# Patient Record
Sex: Male | Born: 1965 | Race: White | Hispanic: No | Marital: Married | Smoking: Former smoker
Health system: Southern US, Community
[De-identification: ages and names within clinical notes are randomized; demographics above are authoritative.]

## PROBLEM LIST (undated history)

## (undated) DIAGNOSIS — N200 Calculus of kidney: Secondary | ICD-10-CM

## (undated) DIAGNOSIS — E079 Disorder of thyroid, unspecified: Secondary | ICD-10-CM

## (undated) HISTORY — PX: FINGER AMPUTATION: SHX636

## (undated) HISTORY — DX: Calculus of kidney: N20.0

## (undated) HISTORY — DX: Disorder of thyroid, unspecified: E07.9

---

## 2005-09-02 ENCOUNTER — Emergency Department: Payer: Self-pay | Admitting: Emergency Medicine

## 2007-07-07 DIAGNOSIS — N200 Calculus of kidney: Secondary | ICD-10-CM

## 2007-07-07 HISTORY — DX: Calculus of kidney: N20.0

## 2009-06-27 ENCOUNTER — Emergency Department: Payer: Self-pay | Admitting: Emergency Medicine

## 2009-11-07 ENCOUNTER — Ambulatory Visit: Payer: Self-pay | Admitting: Vascular Surgery

## 2010-10-23 ENCOUNTER — Ambulatory Visit: Payer: Self-pay | Admitting: Family Medicine

## 2011-01-08 ENCOUNTER — Ambulatory Visit: Payer: Self-pay | Admitting: Internal Medicine

## 2011-03-23 ENCOUNTER — Encounter: Payer: Self-pay | Admitting: Internal Medicine

## 2011-03-23 ENCOUNTER — Ambulatory Visit (INDEPENDENT_AMBULATORY_CARE_PROVIDER_SITE_OTHER): Payer: PRIVATE HEALTH INSURANCE | Admitting: Internal Medicine

## 2011-03-23 VITALS — BP 132/78 | HR 66 | Temp 97.6°F | Resp 16 | Ht 69.0 in | Wt 161.5 lb

## 2011-03-23 DIAGNOSIS — N201 Calculus of ureter: Secondary | ICD-10-CM | POA: Insufficient documentation

## 2011-03-23 DIAGNOSIS — F17200 Nicotine dependence, unspecified, uncomplicated: Secondary | ICD-10-CM

## 2011-03-23 DIAGNOSIS — E079 Disorder of thyroid, unspecified: Secondary | ICD-10-CM | POA: Insufficient documentation

## 2011-03-23 MED ORDER — HYDROCODONE-ACETAMINOPHEN 7.5-750 MG PO TABS: 1.0000 | ORAL_TABLET | Freq: Four times a day (QID) | ORAL | Status: DC | PRN

## 2011-03-23 MED ORDER — BUPROPION HCL ER (SR) 100 MG PO TB12: 100.0000 mg | ORAL_TABLET | Freq: Two times a day (BID) | ORAL | Status: DC

## 2011-03-23 NOTE — Patient Instructions (Signed)
You may use the hydrocodone for thumb pain or severe knee pain  But you should not exceed 2 grams (2000 mg ) of tylenol (acetinominophen) on a daily basis.   We will refer you to Dr. Amanda Pea in [facility]  The hand surgeon  And   Dr. Ernest Pine at [place] for knee problems.

## 2011-03-23 NOTE — Progress Notes (Signed)
  Subjective:    Patient ID: Jeffery Horton, male    DOB: 12-Apr-1966, 45 y.o.   MRN: [id number]  HPI 45 yo white male recently referred to Endocrinology for weight loss and suppressed TSH , workup and followup pending,   here for evaluation of left thumb pain which is episodic and severe.  Preciptants unclear. Has history of remote thumb injury with prior vascular evaluation for ischemic symptoms, with no intervention possible.  He has episodes of pain radiating up to elbow which occure periodically and last for several days.  Also cotinuing to have bilateral knee pain aggravated by prlonged standing on concrete pavements.    Review of Systems  Constitutional: Negative for fever, chills, diaphoresis, activity change, appetite change, fatigue and unexpected weight change.  HENT: Negative for hearing loss, ear pain, nosebleeds, congestion, sore throat, facial swelling, rhinorrhea, sneezing, drooling, mouth sores, trouble swallowing, neck pain, neck stiffness, dental problem, voice change, postnasal drip, sinus pressure, tinnitus and ear discharge.   Eyes: Negative for photophobia, pain, discharge, redness, itching and visual disturbance.  Respiratory: Negative for apnea, cough, choking, chest tightness, shortness of breath, wheezing and stridor.   Cardiovascular: Negative for chest pain, palpitations and leg swelling.  Gastrointestinal: Negative for nausea, vomiting, abdominal pain, diarrhea, constipation, blood in stool, abdominal distention, anal bleeding and rectal pain.  Genitourinary: Negative for dysuria, urgency, frequency, hematuria, flank pain, decreased urine volume, scrotal swelling, difficulty urinating and testicular pain.  Musculoskeletal: Negative for myalgias, back pain, joint swelling, arthralgias and gait problem.  Skin: Negative for color change, rash and wound.  Neurological: Negative for dizziness, tremors, seizures, syncope, speech difficulty, weakness, light-headedness,  numbness and headaches.  Psychiatric/Behavioral: Negative for suicidal ideas, hallucinations, behavioral problems, confusion, sleep disturbance, dysphoric mood, decreased concentration and agitation. The patient is not nervous/anxious.        Objective:   Physical Exam  Constitutional: He is oriented to person, place, and time.  HENT:  Head: Normocephalic and atraumatic.  Mouth/Throat: Oropharynx is clear and moist.  Eyes: Conjunctivae and EOM are normal.  Neck: Normal range of motion. Neck supple. No JVD present. No thyromegaly present.  Cardiovascular: Normal rate, regular rhythm and normal heart sounds.   Pulmonary/Chest: Effort normal and breath sounds normal. He has no wheezes. He has no rales.  Abdominal: Soft. Bowel sounds are normal. He exhibits no mass. There is no tenderness. There is no rebound.  Musculoskeletal: Normal range of motion. He exhibits no edema.  Neurological: He is alert and oriented to person, place, and time.  Skin: Skin is warm and dry.  Psychiatric: He has a normal mood and affect.          Assessment & Plan:  Right thumb pain:  Origin of pain is part ischemic part arthritic from prior trauma.  Vascular evaluation last week was without intervention,  He will need referral to Dr. 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  He is taking tramadol and ibuprofen with incomplete relief.  Refer to Dr. Ernest Pine.  Hyperthyroidism:  Patient was referred to Dr. Tedd Sias but did not call for results of serologies which were drawn an initial visit. Encouraged him to followup with her.

## 2011-03-30 ENCOUNTER — Ambulatory Visit: Payer: Self-pay

## 2011-04-17 ENCOUNTER — Encounter: Payer: Self-pay | Admitting: Internal Medicine

## 2011-04-24 ENCOUNTER — Telehealth: Payer: Self-pay | Admitting: Internal Medicine

## 2011-04-24 DIAGNOSIS — Z720 Tobacco use: Secondary | ICD-10-CM

## 2011-04-24 NOTE — Telephone Encounter (Signed)
Pt came in you gave him bupropion hcl to help him quit smoking.  This is giving headache during day.  Can you prescribe something else  You call xantax [facility]

## 2011-04-24 NOTE — Telephone Encounter (Signed)
Please advise 

## 2011-04-25 MED ORDER — VARENICLINE TARTRATE 0.5 MG X 11 & 1 MG X 42 PO MISC: ORAL | Status: DC

## 2011-04-25 NOTE — Telephone Encounter (Signed)
You can call hi in the Chantix starter Pak.  It cannot be e mailed.  Chart updated.

## 2011-04-27 MED ORDER — VARENICLINE TARTRATE 0.5 MG X 11 & 1 MG X 42 PO MISC: ORAL | Status: AC

## 2011-04-27 NOTE — Telephone Encounter (Signed)
Sent in electronically (original RX sig was too long, I adjusted) - Left pt detailed VM to check w/his pharm

## 2011-05-12 ENCOUNTER — Telehealth: Payer: Self-pay | Admitting: *Deleted

## 2011-05-12 DIAGNOSIS — M25569 Pain in unspecified knee: Secondary | ICD-10-CM

## 2011-05-12 NOTE — Telephone Encounter (Signed)
Patient insurance requires this be written as a 90 day supply .

## 2011-05-13 MED ORDER — TRAMADOL HCL 50 MG PO TABS: 50.0000 mg | ORAL_TABLET | Freq: Four times a day (QID) | ORAL | Status: DC | PRN

## 2011-06-03 ENCOUNTER — Other Ambulatory Visit: Payer: Self-pay | Admitting: Internal Medicine

## 2011-06-03 NOTE — Telephone Encounter (Signed)
Ok to refill the vicodin #60 per month with 2 refills.

## 2011-06-04 MED ORDER — HYDROCODONE-ACETAMINOPHEN 7.5-750 MG PO TABS: 1.0000 | ORAL_TABLET | Freq: Four times a day (QID) | ORAL | Status: DC | PRN

## 2011-08-21 ENCOUNTER — Other Ambulatory Visit: Payer: Self-pay | Admitting: *Deleted

## 2011-08-21 MED ORDER — HYDROCODONE-ACETAMINOPHEN 7.5-750 MG PO TABS: 1.0000 | ORAL_TABLET | Freq: Four times a day (QID) | ORAL | Status: DC | PRN

## 2011-08-21 NOTE — Telephone Encounter (Signed)
Faxed request from [facility].  Last filled date not given.

## 2011-08-21 NOTE — Telephone Encounter (Signed)
Medicine called to pharmacy. 

## 2011-09-01 ENCOUNTER — Telehealth: Payer: Self-pay | Admitting: Internal Medicine

## 2011-09-01 NOTE — Telephone Encounter (Signed)
Patient notified that we have no openings today and to keep his appt for tomorrow.

## 2011-09-01 NOTE — Telephone Encounter (Signed)
[phone] Pt called wanted appointment today He got up Sunday with a knot on left cheek.  Today left side of his face is swollen  Looks like he has had dental   Pt stated it hurts i made appointment for tomorrow 2/27 @  8:45.

## 2011-09-02 ENCOUNTER — Ambulatory Visit (INDEPENDENT_AMBULATORY_CARE_PROVIDER_SITE_OTHER): Payer: PRIVATE HEALTH INSURANCE | Admitting: Internal Medicine

## 2011-09-02 ENCOUNTER — Encounter: Payer: Self-pay | Admitting: Internal Medicine

## 2011-09-02 VITALS — BP 140/82 | HR 88 | Temp 98.4°F | Resp 14 | Ht 69.0 in | Wt 161.8 lb

## 2011-09-02 DIAGNOSIS — L0202 Furuncle of face: Secondary | ICD-10-CM | POA: Insufficient documentation

## 2011-09-02 DIAGNOSIS — G8929 Other chronic pain: Secondary | ICD-10-CM

## 2011-09-02 DIAGNOSIS — E079 Disorder of thyroid, unspecified: Secondary | ICD-10-CM

## 2011-09-02 DIAGNOSIS — L03211 Cellulitis of face: Secondary | ICD-10-CM

## 2011-09-02 DIAGNOSIS — M25569 Pain in unspecified knee: Secondary | ICD-10-CM

## 2011-09-02 DIAGNOSIS — L0201 Cutaneous abscess of face: Secondary | ICD-10-CM

## 2011-09-02 MED ORDER — SULFAMETHOXAZOLE-TRIMETHOPRIM 800-160 MG PO TABS: 1.0000 | ORAL_TABLET | Freq: Two times a day (BID) | ORAL | Status: AC

## 2011-09-02 NOTE — Assessment & Plan Note (Signed)
He had a suppressed TSH last September and was referred to Dr. Tedd Sias for further evaluation.  Followup is this week. Per patient he is not hyperthyroid,  Records requested.

## 2011-09-02 NOTE — Progress Notes (Signed)
Subjective:    Patient ID: Jeffery Horton, male    DOB: 07/25/1965, 46 y.o.   MRN: [id number]  HPI   46 yr old white male present with 3 day history of painful cystic welling of left cheek.  First noticed change in skin 3 days ago.  The following day he noted some bloody drainage in the shower when he treated it like a pimple.  Yesterday it was a little more swollen but it spontaneously drained a bit.  No fevers,  No recent insect bites. Other issues:  He quit smoking one month ago using Cahantix.  Has not smoked in 30 days,  No longer taking chantix.  Has not gained wegiht.  No cravings for junk food.  3rd issue: knees still bother him daily bc he worsk 7 days/wk standing on concrete all day long.  Uses meloxicam and tramadol in the AM,  Prn vicodin in the evening bc tramadol causes insomnia. Pain not any worse.  Did not go back to [facility] after first visit since their treatment was no better than mine ([facility] patient).  4th issue is prior suppressed TSH , resulting in referral to Endocrinology Dr Tedd Sias.  Screening labs were reportely negative for hyperthyroid state and he is scheuled to see her again at the end of this week for 6 month followup.   Past Medical History  Diagnosis Date  . Renal calculi 2009  . Thyroid disease     hyperthyroidism   Current Outpatient Prescriptions on File Prior to Visit  Medication Sig Dispense Refill  . HYDROcodone-acetaminophen (VICODIN ES) 7.5-750 MG per tablet Take 1 tablet by mouth every 6 (six) hours as needed for pain.  60 tablet  2  . traMADol (ULTRAM) 50 MG tablet Take 1 tablet (50 mg total) by mouth 4 (four) times daily as needed for pain.  360 tablet  3   Review of Systems  Constitutional: Negative for fever, chills, diaphoresis, activity change, appetite change, fatigue and unexpected weight change.  HENT: Negative for hearing loss, ear pain, nosebleeds, congestion, sore throat, facial swelling, rhinorrhea, sneezing, drooling, mouth sores,  trouble swallowing, neck pain, neck stiffness, dental problem, voice change, postnasal drip, sinus pressure, tinnitus and ear discharge.   Eyes: Negative for photophobia, pain, discharge, redness, itching and visual disturbance.  Respiratory: Negative for apnea, cough, choking, chest tightness, shortness of breath, wheezing and stridor.   Cardiovascular: Negative for chest pain, palpitations and leg swelling.  Gastrointestinal: Negative for nausea, vomiting, abdominal pain, diarrhea, constipation, blood in stool, abdominal distention, anal bleeding and rectal pain.  Genitourinary: Negative for dysuria, urgency, frequency, hematuria, flank pain, decreased urine volume, scrotal swelling, difficulty urinating and testicular pain.  Musculoskeletal: Positive for arthralgias. Negative for myalgias, back pain, joint swelling and gait problem.  Skin: Positive for rash. Negative for color change and wound.  Neurological: Negative for dizziness, tremors, seizures, syncope, speech difficulty, weakness, light-headedness, numbness and headaches.  Psychiatric/Behavioral: Negative for suicidal ideas, hallucinations, behavioral problems, confusion, sleep disturbance, dysphoric mood, decreased concentration and agitation. The patient is not nervous/anxious.        Objective:   Physical Exam  Constitutional: He is oriented to person, place, and time.  HENT:  Head: Normocephalic and atraumatic.  Mouth/Throat: Oropharynx is clear and moist.  Eyes: Conjunctivae and EOM are normal.  Neck: Normal range of motion. Neck supple. No JVD present. No thyromegaly present.  Cardiovascular: Normal rate, regular rhythm and normal heart sounds.   Pulmonary/Chest: Effort normal and breath sounds normal. He  has no wheezes. He has no rales.  Abdominal: Soft. Bowel sounds are normal. He exhibits no mass. There is no tenderness. There is no rebound.  Musculoskeletal: Normal range of motion. He exhibits no edema.  Neurological: He  is alert and oriented to person, place, and time.  Skin: Skin is warm and dry. Rash noted.     Psychiatric: He has a normal mood and affect.       Assessment & Plan:   Chronic knee pain Secondary to prolonged standing on concrete,  Mild DJD.  Controlled with tramadol and vicodin .  Thyroid disease He had a suppressed TSH last September and was referred to Dr. Tedd Sias for further evaluation.  Followup is this week. Per patient he is not hyperthyroid,  Records requested.   Furuncle of face Will treat for CA MRSA with Septra x  1 week.  No i & d needed.     Updated Medication List Outpatient Encounter Prescriptions as of 09/02/2011  Medication Sig Dispense Refill  . HYDROcodone-acetaminophen (VICODIN ES) 7.5-750 MG per tablet Take 1 tablet by mouth every 6 (six) hours as needed for pain.  60 tablet  2  . traMADol (ULTRAM) 50 MG tablet Take 1 tablet (50 mg total) by mouth 4 (four) times daily as needed for pain.  360 tablet  3  . sulfamethoxazole-trimethoprim (SEPTRA DS) 800-160 MG per tablet Take 1 tablet by mouth 2 (two) times daily.  14 tablet  0  . DISCONTD: buPROPion (WELLBUTRIN SR) 100 MG 12 hr tablet Take 1 tablet (100 mg total) by mouth 2 (two) times daily.  60 tablet  2

## 2011-09-02 NOTE — Patient Instructions (Signed)
Community-Associated MRSA, [place] CA-MRSA stands for community-associated methicillin-resistant Staphylococcus aureus. MRSA is a type of bacteria that is resistant to some common antibiotics. It can cause infections in the skin and many other places in the body. Staphylococcus aureus, often called "staph," is a bacteria that normally lives on the skin or in the nose. Staph on the surface of the skin or in the nose does not cause problems. However, if the staph enters the body through a cut, wound, or break in the skin, an infection can happen. Up until recently, infections with the MRSA type of staph mainly occurred in hospitals and other healthcare settings. There are now increasing problems with MRSA infections in the community as well. Infections with MRSA may be very serious or even life-threatening. CA-MRSA is becoming more common. It is known to spread in crowded settings, in jails and prisons, and in situations where there is close skin-to-skin contact, such as during sporting events or in locker rooms. MRSA can be spread through shared items, such as children's toys, razors, towels, or sports equipment.   CAUSES   All staph, including MRSA, are normally harmless unless they enter the body through a scratch, cut, or wound, such as with surgery. All staph, including MRSA, can be spread from person-to-person by touching contaminated objects or through direct contact.  MRSA now causes illness in people who have not been in hospitals or other healthcare facilities. Cases of MRSA diseases in the community have been associated with:     Recent antibiotic use.     Sharing contaminated towels or clothes.     Having active skin diseases.     Participating in contact sports.     Living in crowded settings.     Intravenous (IV) drug use.     Community-associated MRSA infections are usually skin infections, but may cause other severe illnesses.     Staph bacteria are one of the most common  causes of skin infection. However, they are also a common cause of pneumonia, bone or joint infections, and bloodstream infections.  SCREENING AT [place]-BASED MEDICAL FACILITIES (For compliance with the 2008 Medical Facility Infection Control and Prevention Act) As a part of our hospital's commitment to reduce antibiotic-resistant bacteria and keep patients safe, patients who meet specific criteria will have a culture collected from their nose in order to determine if they are carriers of the MRSA bacteria. If your culture comes back positive, nurses, doctors, and other healthcare staff will wear gowns and gloves when they come into your room. These protective measures are necessary to assure the highest level of safety for our patients.   DIAGNOSIS Diagnosis of MRSA is done by cultures of fluid samples that may come from:  Swabs taken from cuts or wounds in infected areas.     Nasal swabs.     Saliva or deep cough specimens from the lungs (sputum).     Urine.    Blood.  Many people are "colonized" with MRSA but have no signs of infection. This means that people carry the MRSA germ on their skin or in their nose and may never develop MRSA infection.   TREATMENT   Treatment varies and is based on how serious, how deep, or how extensive the infection is. For example:  Some skin infections, such as a small boil or abscess, may be treated by draining yellowish-white fluid (pus) from the site of the infection.     Deeper or more widespread soft tissue infections are usually treated with  surgery to drain pus and with antibiotic medicine given by vein or by mouth. This may be recommended even if you are pregnant.     Serious infections may require a hospital stay.  If antibiotics are given, they may be needed for several weeks. PREVENTION Because many people are colonized with staph, including MRSA, preventing the spread of the bacteria from person-to-person is most important. The best way to  prevent the spread of bacteria and other germs is through proper hand washing or by using alcohol-based hand disinfectants. The following are other ways to help prevent MRSA infection within community settings.    Wash your hands frequently with soap and water for at least 15 seconds. Otherwise, use alcohol-based hand disinfectants when soap and water is not available.     Make sure people who live with you wash their hands often, too.     Do not share personal items. For example, avoid sharing razors and other personal hygiene items, towels, clothing, and athletic equipment.     Wash and dry your clothes and bedding at the warmest temperatures recommended on the labels.     Keep wounds covered. Pus from infected sores may contain MRSA and other bacteria. Keep cuts and abrasions clean and covered with germ-free (sterile), dry bandages until they are healed.     If you have a wound that appears infected, ask your caregiver if a culture for MRSA and other bacteria should be done.     If you are breastfeeding, talk to your caregiver about MRSA. You may be asked to temporarily stop breastfeeding.  HOME CARE INSTRUCTIONS    Take your antibiotics as directed. Finish them even if you start to feel better.     Avoid close contact with those around you as much as possible. Do not use towels, razors, toothbrushes, bedding, or other items that will be used by others.     To fight the infection, follow your caregiver's instructions for wound care. Wash your hands before and after changing your bandages.     If you have an intravascular device, such as a catheter, make sure you know how to care for it.     Be sure to tell any healthcare providers that you have MRSA so they are aware of your infection.  SEEK IMMEDIATE MEDICAL CARE IF:    The infection appears to be getting worse. Signs include:     Increased warmth, redness, or tenderness around the wound site.     A red line that extends from the  infection site.     A dark color in the area around the infection.     Wound drainage that is tan, yellow, or green.     A bad smell coming from the wound.     You feel sick to your stomach (nauseous) and throw up (vomit) or cannot keep medicine down.     You have a fever.     Your baby is older than 3 months with a rectal temperature of 102 F (38.9 C) or higher.     Your baby is 66 months old or younger with a rectal temperature of 100.4 F (38 C) or higher.     You have difficulty breathing.  MAKE SURE YOU:    Understand these instructions.     Will watch your condition.     Will get help right away if you are not doing well or get worse.  Document Released: 03/31/2008 Document Revised: 03/04/2011 Document Reviewed:  09/25/2010 Exit[employer] Patient Information 2012 [place], [place].

## 2011-09-02 NOTE — Assessment & Plan Note (Signed)
Secondary to prolonged standing on concrete,  Mild DJD.  Controlled with tramadol and vicodin .

## 2011-09-02 NOTE — Assessment & Plan Note (Addendum)
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  No i & d needed.

## 2012-02-12 ENCOUNTER — Other Ambulatory Visit: Payer: Self-pay | Admitting: Internal Medicine

## 2012-06-08 ENCOUNTER — Other Ambulatory Visit: Payer: Self-pay

## 2012-06-08 NOTE — Telephone Encounter (Signed)
Refill request for hydrocodone acetaminophen 75-750 mg. Ok to refill

## 2012-06-10 ENCOUNTER — Other Ambulatory Visit: Payer: Self-pay

## 2012-06-10 NOTE — Telephone Encounter (Signed)
Refill request from [facility]  for Hydrocodone Acetaminophen 7.5-750 mg. Ok to refill?

## 2012-06-14 ENCOUNTER — Other Ambulatory Visit: Payer: Self-pay

## 2012-06-14 MED ORDER — HYDROCODONE-ACETAMINOPHEN 7.5-750 MG PO TABS: 1.0000 | ORAL_TABLET | Freq: Four times a day (QID) | ORAL | Status: DC | PRN

## 2012-06-14 NOTE — Telephone Encounter (Signed)
Refill request for Hydrocodone Acetaminophen 7.5-500 mg. Ok to refill?

## 2012-06-15 ENCOUNTER — Other Ambulatory Visit: Payer: Self-pay

## 2012-06-15 NOTE — Telephone Encounter (Signed)
Hydrocodone acetaminophen 7.5-750 mg faxed to [facility]

## 2012-06-20 ENCOUNTER — Other Ambulatory Visit: Payer: Self-pay | Admitting: Internal Medicine

## 2012-06-21 ENCOUNTER — Other Ambulatory Visit: Payer: Self-pay

## 2012-06-21 ENCOUNTER — Other Ambulatory Visit: Payer: Self-pay | Admitting: Internal Medicine

## 2012-06-21 DIAGNOSIS — M25569 Pain in unspecified knee: Secondary | ICD-10-CM

## 2012-06-21 NOTE — Telephone Encounter (Signed)
Refill request for Tramadol 50 mg.Ok to refill?

## 2012-06-22 ENCOUNTER — Other Ambulatory Visit: Payer: Self-pay

## 2012-06-22 NOTE — Telephone Encounter (Signed)
Patient request refill for Tramadol 50 mg. Ok to refill?

## 2012-06-22 NOTE — Telephone Encounter (Signed)
He has refills per epic.

## 2012-06-26 MED ORDER — TRAMADOL HCL 50 MG PO TABS: 50.0000 mg | ORAL_TABLET | Freq: Four times a day (QID) | ORAL | Status: DC | PRN

## 2012-06-27 MED ORDER — TRAMADOL HCL 50 MG PO TABS: 50.0000 mg | ORAL_TABLET | Freq: Four times a day (QID) | ORAL | Status: DC | PRN

## 2012-06-27 NOTE — Telephone Encounter (Signed)
Pt is currently taking Tramadol and Vicodin. Pt has not been seen since 08/21/11.

## 2012-06-27 NOTE — Telephone Encounter (Signed)
He was also asking about Hyrdocodone as well ??? May need to call pt.

## 2012-06-27 NOTE — Telephone Encounter (Signed)
Med refilled per Dr. Dan Humphreys.

## 2012-06-27 NOTE — Telephone Encounter (Signed)
Fine to refill Tramadol, however looks like was refilled 12/18. Pt just had refill of Vicodin 12/10. If needs more refills, will need to talk with Dr. Darrick Huntsman

## 2012-06-27 NOTE — Telephone Encounter (Signed)
Pt came by and is wanting his tramadol sent in.

## 2012-07-01 ENCOUNTER — Other Ambulatory Visit: Payer: Self-pay | Admitting: General Practice

## 2012-10-27 ENCOUNTER — Other Ambulatory Visit: Payer: Self-pay | Admitting: Internal Medicine

## 2013-02-28 ENCOUNTER — Other Ambulatory Visit: Payer: Self-pay | Admitting: Internal Medicine

## 2013-03-01 NOTE — Telephone Encounter (Signed)
Needs office visit,  Will not be refilled again without on

## 2013-03-01 NOTE — Telephone Encounter (Signed)
Okay to refill? 

## 2013-03-01 NOTE — Telephone Encounter (Signed)
Refill one 30 days only.  Has not been seen in one year so he needs a  30 minute visit.  

## 2013-03-02 ENCOUNTER — Other Ambulatory Visit: Payer: Self-pay | Admitting: Internal Medicine

## 2013-03-02 ENCOUNTER — Encounter: Payer: Self-pay | Admitting: *Deleted

## 2013-03-02 NOTE — Telephone Encounter (Signed)
Voicemail full, no answer. Mailed letter

## 2013-03-02 NOTE — Telephone Encounter (Signed)
Pt Rx was already printed on 8/26 (see other note from today) Rx needs to be picked up

## 2013-03-02 NOTE — Telephone Encounter (Signed)
The patient has scheduled an appointment for 9.16.14 . He is out of his medication . He asked if some of the medication could be called in until he is seen.

## 2013-03-21 ENCOUNTER — Ambulatory Visit (INDEPENDENT_AMBULATORY_CARE_PROVIDER_SITE_OTHER): Payer: PRIVATE HEALTH INSURANCE | Admitting: Internal Medicine

## 2013-03-21 ENCOUNTER — Encounter: Payer: Self-pay | Admitting: *Deleted

## 2013-03-21 ENCOUNTER — Encounter: Payer: Self-pay | Admitting: Internal Medicine

## 2013-03-21 VITALS — BP 142/94 | HR 71 | Temp 98.2°F | Resp 14 | Ht 69.0 in | Wt 168.8 lb

## 2013-03-21 DIAGNOSIS — M25569 Pain in unspecified knee: Secondary | ICD-10-CM

## 2013-03-21 DIAGNOSIS — E079 Disorder of thyroid, unspecified: Secondary | ICD-10-CM

## 2013-03-21 DIAGNOSIS — Z79899 Other long term (current) drug therapy: Secondary | ICD-10-CM

## 2013-03-21 DIAGNOSIS — G8929 Other chronic pain: Secondary | ICD-10-CM

## 2013-03-21 DIAGNOSIS — Z13220 Encounter for screening for lipoid disorders: Secondary | ICD-10-CM

## 2013-03-21 DIAGNOSIS — Z23 Encounter for immunization: Secondary | ICD-10-CM

## 2013-03-21 DIAGNOSIS — R5381 Other malaise: Secondary | ICD-10-CM

## 2013-03-21 DIAGNOSIS — M722 Plantar fascial fibromatosis: Secondary | ICD-10-CM

## 2013-03-21 MED ORDER — HYDROCODONE-ACETAMINOPHEN 7.5-750 MG PO TABS: 1.0000 | ORAL_TABLET | Freq: Four times a day (QID) | ORAL | Status: DC | PRN

## 2013-03-21 NOTE — Patient Instructions (Addendum)
Please give the meloxicam another try because it may help the plantar fasciitis  If it does not,  We can try another generic NSAID in its place   Try the exercises I have given you and follow up with DR Ether Griffins if no improvement

## 2013-03-21 NOTE — Progress Notes (Signed)
Patient ID: Jeffery Horton, male   DOB: [birth year], 47 y.o.   MRN: [id number]    Patient Active Problem List   Diagnosis Date Noted  . Chronic knee pain 09/02/2011  . Furuncle of face 09/02/2011  . Renal calculi   . Thyroid disease     Subjective:  CC:   Chief Complaint  Patient presents with  . Follow-up    medication refills    HPI:   Jeffery Horton a 47 y.o. male who presents for follow up on chronic pain requiring  refill on tramadol. Last seen 1.5 years ago.,  Bilateral knee pain with prior orthopedic evaluation recommending use of meloxicam which he states does not help his pain   Stands on concrete all day at work.  Since his last visit he has developed left plantar foot pain.  He was treated for a " torn muscle" by Podiatry Gwyneth Revels) sold a  $500 foot insert and given a cortisone injection which did not relieve his pain for more than a day. Did some reading on the internet and thinks he has plantar fasciitis.   Has not tried the meloxicam yet for the foot pain but has some left.  Has been using the tramadol for knee pain since the mobic did not work .  Taking one or two tramadol only in the morning,  But he found that if he takes it in the afternoon it keeps him awake. Requesting refill of vicodin to take in the evening .     Past Medical History  Diagnosis Date  . Renal calculi 2009  . Thyroid disease     hyperthyroidism    Past Surgical History  Procedure Laterality Date  . 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    Review of Systems:   12 Pt  review of systems was negative except those addressed in the HPI,     History   Social History  . Marital Status: Married    Spouse Name: N/A    Number of Children: N/A  . Years of Education: N/A    Occupational History  . Not on file.   Social History Main Topics  . Smoking status: Former Smoker -- 1.00 packs/day    Types: Cigarettes    Quit date: 08/02/2011  . Smokeless tobacco: Never Used  . Alcohol Use: No  . Drug Use: No  . Sexual Activity: Not on file   Other Topics Concern  . Not on file   Social History Narrative  . No narrative on file    Objective:  Filed Vitals:   03/21/13 1551  BP: 142/94  Pulse: 71  Temp: 98.2 F (36.8 C)  Resp: 14     General appearance: alert, cooperative and appears stated age Ears: normal TM's and external ear canals both ears Throat: lips, mucosa, and tongue normal; teeth and gums normal Neck: no adenopathy, no carotid bruit, supple, symmetrical, trachea midline and thyroid not enlarged, symmetric, no tenderness/mass/nodules Back: symmetric, no curvature. ROM normal. No CVA tenderness. Lungs: clear to auscultation bilaterally Heart: regular rate and rhythm, S1, S2 normal, no murmur, click, rub or gallop Abdomen: soft, non-tender; bowel sounds normal; no masses,  no organomegaly Pulses: 2+ and symmetric Skin: Skin color, texture, turgor normal. No rashes or lesions Lymph nodes:  Cervical, supraclavicular, and axillary nodes normal.  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 Managed with daytime tramadol and evening vicodin.   Thyroid disease He did not follow up with Endocrinology last year.  Repeat TSH is 0.33,  T4 is pending  Plantar fasciitis, left Advised to resume NSAID use and podiatry follow up   Updated Medication List Outpatient Encounter Prescriptions as of 03/21/2013  Medication Sig Dispense Refill  . traMADol (ULTRAM) 50 MG tablet TAKE 1 TABLET (50 MG TOTAL) BY MOUTH EVERY 6 (SIX) HOURS AS NEEDED FOR PAIN.  120 tablet  0  . HYDROcodone-acetaminophen (VICODIN ES) 7.5-750 MG per tablet Take 1 tablet by mouth every 6 (six) hours as needed for  pain.  30 tablet  5  . [DISCONTINUED] HYDROcodone-acetaminophen (VICODIN ES) 7.5-750 MG per tablet Take 1 tablet by mouth every 6 (six) hours as needed for pain.  60 tablet  2   No facility-administered encounter medications on file as of 03/21/2013.

## 2013-03-22 ENCOUNTER — Encounter: Payer: Self-pay | Admitting: Internal Medicine

## 2013-03-22 DIAGNOSIS — M722 Plantar fascial fibromatosis: Secondary | ICD-10-CM | POA: Insufficient documentation

## 2013-03-22 LAB — CBC WITH DIFFERENTIAL/PLATELET
Basophils Absolute: 0.1 10*3/uL (ref 0.0–0.1)
Basophils Relative: 1.4 % (ref 0.0–3.0)
HCT: 40.5 % (ref 39.0–52.0)
Hemoglobin: 14 g/dL (ref 13.0–17.0)
Lymphs Abs: 2 10*3/uL (ref 0.7–4.0)
Monocytes Relative: 8.3 % (ref 3.0–12.0)
Neutro Abs: 2.7 10*3/uL (ref 1.4–7.7)
RBC: 4.48 Mil/uL (ref 4.22–5.81)
RDW: 12.4 % (ref 11.5–14.6)

## 2013-03-22 LAB — COMPREHENSIVE METABOLIC PANEL
ALT: 20 U/L (ref 0–53)
AST: 20 U/L (ref 0–37)
BUN: 12 mg/dL (ref 6–23)
Creatinine, Ser: 1.1 mg/dL (ref 0.4–1.5)
GFR: 75.73 mL/min (ref >60.00)
Total Bilirubin: 0.7 mg/dL (ref 0.3–1.2)

## 2013-03-22 LAB — LIPID PANEL: HDL: 56.2 mg/dL (ref >39.00)

## 2013-03-22 NOTE — Assessment & Plan Note (Signed)
He did not follow up with Endocrinology last year.  Repeat TSH is 0.33,  T4 is pending

## 2013-03-22 NOTE — Assessment & Plan Note (Addendum)
secondaryto mild DJD aggravated by standing on cement all day long. Managed with daytime tramadol and evening vicodin.

## 2013-03-22 NOTE — Assessment & Plan Note (Signed)
Advised to resume NSAID use and podiatry follow up

## 2013-03-23 ENCOUNTER — Encounter: Payer: Self-pay | Admitting: *Deleted

## 2013-03-23 ENCOUNTER — Ambulatory Visit: Payer: PRIVATE HEALTH INSURANCE

## 2013-03-23 DIAGNOSIS — R5381 Other malaise: Secondary | ICD-10-CM

## 2013-03-23 LAB — T4, FREE: Free T4: 1.08 ng/dL (ref 0.60–1.60)

## 2013-04-06 ENCOUNTER — Encounter: Payer: Self-pay | Admitting: Internal Medicine

## 2013-04-12 ENCOUNTER — Other Ambulatory Visit: Payer: Self-pay | Admitting: *Deleted

## 2013-04-12 MED ORDER — TRAMADOL HCL 50 MG PO TABS: ORAL_TABLET | ORAL | Status: DC

## 2013-04-13 NOTE — Telephone Encounter (Signed)
Rx faxed to pharmacy  

## 2013-05-04 ENCOUNTER — Telehealth: Payer: Self-pay | Admitting: Internal Medicine

## 2013-05-04 NOTE — Telephone Encounter (Signed)
HYDROcodone-acetaminophen (VICODIN ES) 7.5-750 MG per tablet

## 2013-05-05 ENCOUNTER — Other Ambulatory Visit: Payer: Self-pay | Admitting: Internal Medicine

## 2013-05-05 MED ORDER — HYDROCODONE-ACETAMINOPHEN 7.5-750 MG PO TABS: 1.0000 | ORAL_TABLET | Freq: Every evening | ORAL | Status: DC | PRN

## 2013-05-05 NOTE — Telephone Encounter (Signed)
Ok to refill 

## 2013-05-08 ENCOUNTER — Telehealth: Payer: Self-pay | Admitting: *Deleted

## 2013-05-08 MED ORDER — HYDROCODONE-ACETAMINOPHEN 10-325 MG PO TABS: 1.0000 | ORAL_TABLET | Freq: Every day | ORAL | Status: DC | PRN

## 2013-05-08 NOTE — Telephone Encounter (Signed)
Patient script for hydrocodone 7.5 / 750 pharmacy advised patient they do ot make amnymore. Patient states also need refill for tramadol.

## 2013-05-08 NOTE — Telephone Encounter (Signed)
New rx on printer

## 2013-05-08 NOTE — Telephone Encounter (Signed)
Dis regard previous on tramadol patient has 4 remaining refills. But patient needs script for Hydrocodone changed per note.

## 2013-05-08 NOTE — Telephone Encounter (Signed)
Tried to reach patient and notify patient medication ready for pick up no voicemail setup could not leave message.

## 2013-07-05 ENCOUNTER — Other Ambulatory Visit: Payer: Self-pay | Admitting: Internal Medicine

## 2013-07-05 MED ORDER — HYDROCODONE-ACETAMINOPHEN 10-325 MG PO TABS: 1.0000 | ORAL_TABLET | Freq: Every day | ORAL | Status: DC | PRN

## 2013-07-05 NOTE — Telephone Encounter (Signed)
Ok refill? 

## 2013-07-05 NOTE — Telephone Encounter (Signed)
Pt came in today to get refill on hydrocodon acetaminophin 10-325   Please call pt when ready to pick up

## 2013-07-05 NOTE — Telephone Encounter (Signed)
Remind patient of refill policy ,  He should not expect to walk in and receive a refill same hour even if it is due. rx printed

## 2013-07-07 NOTE — Telephone Encounter (Signed)
Attempted to call pt, VM full, unable to leave message.

## 2013-08-29 ENCOUNTER — Telehealth: Payer: Self-pay | Admitting: *Deleted

## 2013-08-29 MED ORDER — HYDROCODONE-ACETAMINOPHEN 10-325 MG PO TABS: 1.0000 | ORAL_TABLET | Freq: Every day | ORAL | Status: DC | PRN

## 2013-08-29 NOTE — Telephone Encounter (Signed)
Patient called for refill on Hydrocodone last fill 12/14 Please advise? Patient aware of refill policy and that will be tomorrow weather permitting.

## 2013-08-29 NOTE — Telephone Encounter (Signed)
Ok to refill,  printed rx  

## 2013-08-30 NOTE — Telephone Encounter (Signed)
Tried to notify patient script ready pick up no voicemail.

## 2013-10-25 ENCOUNTER — Other Ambulatory Visit: Payer: Self-pay | Admitting: Internal Medicine

## 2013-10-25 NOTE — Telephone Encounter (Signed)
VM not set up, mailed a letter with need for appointment.

## 2013-10-25 NOTE — Telephone Encounter (Signed)
Refill one 30 days only.  Has not been seen in over 6 months so needs office visit prior to any more refills.  Please inform patient of our policy that all patients who take antidepressants or controlled substances need to be seen every 6 months by their PCP to continue receiving refills.

## 2013-10-25 NOTE — Telephone Encounter (Signed)
Last visit 03/21/13, no future appts scheduled

## 2013-11-23 ENCOUNTER — Encounter (INDEPENDENT_AMBULATORY_CARE_PROVIDER_SITE_OTHER): Payer: Self-pay

## 2013-11-23 ENCOUNTER — Ambulatory Visit (INDEPENDENT_AMBULATORY_CARE_PROVIDER_SITE_OTHER): Payer: PRIVATE HEALTH INSURANCE | Admitting: Adult Health

## 2013-11-23 ENCOUNTER — Encounter: Payer: Self-pay | Admitting: Adult Health

## 2013-11-23 VITALS — BP 142/84 | HR 61 | Temp 98.5°F | Resp 14 | Wt 170.5 lb

## 2013-11-23 DIAGNOSIS — M25569 Pain in unspecified knee: Secondary | ICD-10-CM

## 2013-11-23 DIAGNOSIS — G8929 Other chronic pain: Secondary | ICD-10-CM

## 2013-11-23 MED ORDER — HYDROCODONE-ACETAMINOPHEN 10-325 MG PO TABS: 1.0000 | ORAL_TABLET | Freq: Every day | ORAL | Status: DC | PRN

## 2013-11-23 MED ORDER — TRAMADOL HCL 50 MG PO TABS: 50.0000 mg | ORAL_TABLET | Freq: Four times a day (QID) | ORAL | Status: DC | PRN

## 2013-11-23 NOTE — Progress Notes (Signed)
   Subjective:    Patient ID: Jeffery Horton, male    DOB: Mar 17, 1966, 48 y.o.   MRN: [id number]  HPI Pt is a pleasant 48 y/o male with hx of chronic knee pain on tramadol and norco prn who presents to clinic for medication refills. Pt taking medication as prescribed. He denies any side effects other than mild constipation which is typical of any narcotic.  Past Medical History  Diagnosis Date  . Renal calculi 2009  . Thyroid disease     hyperthyroidism    Review of Systems  Constitutional: Negative.   HENT: Negative.   Eyes: Negative.   Respiratory: Negative.   Cardiovascular: Negative.   Gastrointestinal: Negative.   Endocrine: Negative.   Genitourinary: Negative.   Musculoskeletal: Positive for arthralgias (chronic knee pain).  Skin: Negative.   Allergic/Immunologic: Negative.   Neurological: Negative.   Hematological: Negative.   Psychiatric/Behavioral: Negative.        Objective:   Physical Exam  Constitutional: He is oriented to person, place, and time. He appears well-developed and well-nourished. No distress.  HENT:  Head: Normocephalic and atraumatic.  Eyes: Conjunctivae and EOM are normal.  Neck: Normal range of motion. Neck supple. No tracheal deviation present.  Cardiovascular: Normal rate and regular rhythm.   Pulmonary/Chest: Effort normal. No respiratory distress.  Musculoskeletal: Normal range of motion. He exhibits no edema and no tenderness.  Neurological: He is alert and oriented to person, place, and time.  Skin: Skin is warm and dry.  Psychiatric: He has a normal mood and affect. His behavior is normal. Judgment and thought content normal.      Assessment & Plan:   1. Chronic knee pain Refills provided - tramadol and norco. He is managing his pain well.

## 2013-11-23 NOTE — Progress Notes (Signed)
Pre visit review using our clinic review tool, if applicable. No additional management support is needed unless otherwise documented below in the visit note. 

## 2014-01-12 ENCOUNTER — Telehealth: Payer: Self-pay | Admitting: Internal Medicine

## 2014-01-12 MED ORDER — HYDROCODONE-ACETAMINOPHEN 10-325 MG PO TABS: 1.0000 | ORAL_TABLET | Freq: Every day | ORAL | Status: DC | PRN

## 2014-01-12 NOTE — Telephone Encounter (Signed)
Ok to refill,  printed rx  

## 2014-01-12 NOTE — Telephone Encounter (Signed)
Patient requesting refill on Hydrocodone, patient stated will pick up Monday if OK. Please advise.

## 2014-01-15 NOTE — Telephone Encounter (Signed)
Patient notified and script placed at front desk. 

## 2014-03-09 ENCOUNTER — Other Ambulatory Visit: Payer: Self-pay | Admitting: Internal Medicine

## 2014-03-09 ENCOUNTER — Telehealth: Payer: Self-pay | Admitting: Internal Medicine

## 2014-03-09 MED ORDER — HYDROCODONE-ACETAMINOPHEN 10-325 MG PO TABS: 1.0000 | ORAL_TABLET | Freq: Every day | ORAL | Status: DC | PRN

## 2014-03-09 NOTE — Telephone Encounter (Signed)
Patient requesting refill on Hydrocodone. OK to fill?

## 2014-03-09 NOTE — Progress Notes (Signed)
Scripts placed at front desk , patient has no voicemail, will continue to call.

## 2014-03-13 NOTE — Progress Notes (Signed)
Pt picked up 03/09/14

## 2014-06-21 ENCOUNTER — Telehealth: Payer: Self-pay | Admitting: Internal Medicine

## 2014-06-21 MED ORDER — HYDROCODONE-ACETAMINOPHEN 10-325 MG PO TABS: 1.0000 | ORAL_TABLET | Freq: Every day | ORAL | Status: DC | PRN

## 2014-06-21 NOTE — Telephone Encounter (Signed)
Please remind patient that he needs 6 month follow up,. No refills past today without OV . Refill printed

## 2014-06-21 NOTE — Telephone Encounter (Signed)
Called, no answer, no VM set up

## 2014-06-21 NOTE — Telephone Encounter (Signed)
Last visit 11/23/13 

## 2014-06-21 NOTE — Telephone Encounter (Signed)
Patient would like his hydrocodone prescription refilled.

## 2014-06-22 ENCOUNTER — Other Ambulatory Visit: Payer: Self-pay | Admitting: *Deleted

## 2014-06-22 ENCOUNTER — Telehealth: Payer: Self-pay | Admitting: Internal Medicine

## 2014-06-22 MED ORDER — TRAMADOL HCL 50 MG PO TABS: 50.0000 mg | ORAL_TABLET | Freq: Four times a day (QID) | ORAL | Status: DC | PRN

## 2014-06-22 NOTE — Telephone Encounter (Signed)
See other refill encounter.

## 2014-06-22 NOTE — Telephone Encounter (Signed)
[place], appt scheduled for January

## 2014-06-22 NOTE — Telephone Encounter (Signed)
Ok to refill,  printed rx  

## 2014-06-22 NOTE — Telephone Encounter (Signed)
Called, no answer, no VM set up

## 2014-06-22 NOTE — Telephone Encounter (Signed)
Called to pharmacy 

## 2014-06-22 NOTE — Telephone Encounter (Signed)
Appt has been scheduled 07/18/14

## 2014-07-18 ENCOUNTER — Ambulatory Visit: Payer: PRIVATE HEALTH INSURANCE | Admitting: Internal Medicine

## 2014-07-27 ENCOUNTER — Ambulatory Visit: Payer: PRIVATE HEALTH INSURANCE | Admitting: Internal Medicine

## 2014-07-30 ENCOUNTER — Encounter: Payer: Self-pay | Admitting: Nurse Practitioner

## 2014-07-30 ENCOUNTER — Ambulatory Visit (INDEPENDENT_AMBULATORY_CARE_PROVIDER_SITE_OTHER): Payer: PRIVATE HEALTH INSURANCE | Admitting: Nurse Practitioner

## 2014-07-30 VITALS — BP 140/82 | HR 67 | Temp 98.1°F | Resp 12 | Ht 69.0 in | Wt 169.0 lb

## 2014-07-30 DIAGNOSIS — G8929 Other chronic pain: Secondary | ICD-10-CM

## 2014-07-30 DIAGNOSIS — M25569 Pain in unspecified knee: Secondary | ICD-10-CM

## 2014-07-30 MED ORDER — HYDROCODONE-ACETAMINOPHEN 10-325 MG PO TABS
1.0000 | ORAL_TABLET | Freq: Every day | ORAL | Status: DC
Start: 2014-07-30 — End: 2014-10-02

## 2014-07-30 MED ORDER — TRAMADOL HCL 50 MG PO TABS: 50.0000 mg | ORAL_TABLET | Freq: Four times a day (QID) | ORAL | Status: DC | PRN

## 2014-07-30 NOTE — Assessment & Plan Note (Signed)
Stable. Controlled with Tramadol and Norco. Pt was due for urine drug screen last March. Sample was given at today's visit and sent for screening. Rx for tramadol and norco for 1 month. FU prn medications.

## 2014-07-30 NOTE — Patient Instructions (Signed)
Call us when you need us! Please make an appointment for your physical before leaving today.    Happy Birthday this week!

## 2014-07-30 NOTE — Progress Notes (Signed)
   Subjective:    Patient ID: Jeffery MalteseAnthony J Gienger Sr., male    DOB: 04-Nov-1965, 49 y.o.   MRN: [id number]  HPI  Mr. Jeffery Horton is a 49 yo male with a CC of chronic knee pain and right chronic thumb pain.   1) Pt takes Tramadol and Norco for both knees painful and right thumb.  Tramadol- Takes daily. Last dose today takes in morning.  Hydrocodone- Last dose Saturday approx.   Pain controlled well with medications.    Review of Systems  Constitutional: Negative for fever, chills, diaphoresis and fatigue.  Eyes: Negative for visual disturbance.  Respiratory: Negative for cough, chest tightness, shortness of breath and wheezing.   Musculoskeletal: Positive for arthralgias. Negative for myalgias, joint swelling, gait problem, neck pain and neck stiffness.       Right thumb contracture  Skin: Negative for color change and rash.      Objective:   Physical Exam  Constitutional: He is oriented to person, place, and time. He appears well-developed and well-nourished. No distress.  HENT:  Head: Normocephalic and atraumatic.  Right Ear: External ear normal.  Left Ear: External ear normal.  Cardiovascular: Normal rate, regular rhythm, normal heart sounds and intact distal pulses.  Exam reveals no gallop and no friction rub.   No murmur heard. Pulmonary/Chest: Effort normal and breath sounds normal. No respiratory distress. He has no wheezes. He has no rales. He exhibits no tenderness.  Musculoskeletal: Normal range of motion. He exhibits tenderness. He exhibits no edema.  Right thumb contracture  Neurological: He is alert and oriented to person, place, and time.  Skin: Skin is warm and dry. No rash noted. He is not diaphoretic.  Psychiatric: He has a normal mood and affect. His behavior is normal. Judgment and thought content normal.      Assessment & Plan:

## 2014-07-30 NOTE — Progress Notes (Signed)
Pre visit review using our clinic review tool, if applicable. No additional management support is needed unless otherwise documented below in the visit note. 

## 2014-08-07 ENCOUNTER — Telehealth: Payer: Self-pay | Admitting: Internal Medicine

## 2014-08-07 NOTE — Telephone Encounter (Signed)
Tried to reach patient by phone no answer and patient does not have voicemail set up.

## 2014-08-07 NOTE — Telephone Encounter (Signed)
Patient stated he probably drinks twice per year and that he had guest the night before and that he wanted to assure you does not happen regularly. FYI

## 2014-08-07 NOTE — Telephone Encounter (Signed)
His urine drug screen noted a high level of alcohol metabolites. He should not be drinking more than one drink per night with concurrent use of tramadol.  He will need to be tested again in one month, and if it is still this high, he will need to make an appt to discuss his alcohol use.

## 2014-08-20 ENCOUNTER — Encounter: Payer: Self-pay | Admitting: Internal Medicine

## 2014-09-11 ENCOUNTER — Other Ambulatory Visit: Payer: Self-pay | Admitting: Nurse Practitioner

## 2014-09-12 ENCOUNTER — Other Ambulatory Visit: Payer: Self-pay | Admitting: Internal Medicine

## 2014-09-12 MED ORDER — TRAMADOL HCL 50 MG PO TABS: 50.0000 mg | ORAL_TABLET | Freq: Four times a day (QID) | ORAL | Status: DC | PRN

## 2014-09-12 NOTE — Telephone Encounter (Signed)
Tramadol Rx faxed to pharmacy

## 2014-09-12 NOTE — Progress Notes (Signed)
Rx was faxed. VM full, unable to leave msg

## 2014-09-12 NOTE — Telephone Encounter (Signed)
Last visit 07/30/14 with Jeffery Horton, last visit with you 03/21/13, refill?

## 2014-10-02 ENCOUNTER — Other Ambulatory Visit: Payer: Self-pay | Admitting: Internal Medicine

## 2014-10-02 NOTE — Telephone Encounter (Signed)
Patient called requesting refill on hydrocodone ok to fill script pended

## 2014-10-03 MED ORDER — HYDROCODONE-ACETAMINOPHEN 10-325 MG PO TABS: 1.0000 | ORAL_TABLET | Freq: Every day | ORAL | Status: DC

## 2014-10-03 NOTE — Telephone Encounter (Signed)
[facility]

## 2014-10-03 NOTE — Telephone Encounter (Signed)
Ok to refill,  printed rx  But needs repeat UDS bc last screen was positive for alcohol

## 2014-10-08 ENCOUNTER — Telehealth: Payer: Self-pay | Admitting: Internal Medicine

## 2014-10-08 NOTE — Telephone Encounter (Signed)
Pt came in to get printed rx. Pt was told that he needs to do a urine drug screen before he can be given his rx. Pt advise that he has to go to work and he recently done a urine drug screen 2 months ago. Pt refused to UDS and left. msn

## 2014-10-09 NOTE — Telephone Encounter (Signed)
Patient stated he will schedule an appointment to discuss.

## 2014-10-16 ENCOUNTER — Other Ambulatory Visit: Payer: Self-pay | Admitting: Internal Medicine

## 2014-10-16 NOTE — Telephone Encounter (Signed)
10/08/14 telephone note states pt declined UDS

## 2014-11-05 ENCOUNTER — Other Ambulatory Visit: Payer: Self-pay | Admitting: Internal Medicine

## 2014-11-05 NOTE — Telephone Encounter (Signed)
No refills on either without a UDS.  Not negotiable.

## 2014-11-05 NOTE — Telephone Encounter (Signed)
Patient called for refill on tramadol and hydrocodone please advise does patient need OV  First patient refused UDS last refill request.

## 2014-11-06 MED ORDER — TRAMADOL HCL 50 MG PO TABS: 50.0000 mg | ORAL_TABLET | Freq: Four times a day (QID) | ORAL | Status: DC | PRN

## 2014-11-06 NOTE — Telephone Encounter (Signed)
Tramadol printed 

## 2014-11-06 NOTE — Telephone Encounter (Signed)
Patient agrees to pick up script I still have the Norco script but need tramadol .

## 2014-12-14 ENCOUNTER — Other Ambulatory Visit: Payer: Self-pay | Admitting: Internal Medicine

## 2014-12-14 NOTE — Telephone Encounter (Signed)
Denied,  He did not return for the UDS.  He will need to see Orthopedics if he needs anything stronger than Mobic.  I will no longer prescribe tramadol or hydrocone to him.

## 2014-12-14 NOTE — Telephone Encounter (Signed)
LAst visit 07/30/14 with Jeffery Horton, refill?

## 2014-12-14 NOTE — Telephone Encounter (Signed)
Patient stated he is willing to try Mobic if MD will Prescribe.

## 2014-12-18 ENCOUNTER — Encounter: Payer: Self-pay | Admitting: Internal Medicine

## 2014-12-20 ENCOUNTER — Ambulatory Visit (INDEPENDENT_AMBULATORY_CARE_PROVIDER_SITE_OTHER): Payer: PRIVATE HEALTH INSURANCE | Admitting: Internal Medicine

## 2014-12-20 ENCOUNTER — Encounter: Payer: Self-pay | Admitting: Internal Medicine

## 2014-12-20 VITALS — BP 146/98 | HR 73 | Temp 98.2°F | Resp 14 | Ht 69.0 in | Wt 169.8 lb

## 2014-12-20 DIAGNOSIS — R030 Elevated blood-pressure reading, without diagnosis of hypertension: Secondary | ICD-10-CM

## 2014-12-20 DIAGNOSIS — M25562 Pain in left knee: Secondary | ICD-10-CM

## 2014-12-20 DIAGNOSIS — G8929 Other chronic pain: Secondary | ICD-10-CM

## 2014-12-20 DIAGNOSIS — M25561 Pain in right knee: Secondary | ICD-10-CM

## 2014-12-20 DIAGNOSIS — E079 Disorder of thyroid, unspecified: Secondary | ICD-10-CM

## 2014-12-20 LAB — COMPREHENSIVE METABOLIC PANEL
ALT: 20 U/L (ref 0–53)
AST: 19 U/L (ref 0–37)
Albumin: 4.7 g/dL (ref 3.5–5.2)
Alkaline Phosphatase: 68 U/L (ref 39–117)
BUN: 14 mg/dL (ref 6–23)
CO2: 30 meq/L (ref 19–32)
CREATININE: 1.12 mg/dL (ref 0.40–1.50)
Calcium: 10.1 mg/dL (ref 8.4–10.5)
Chloride: 104 mEq/L (ref 96–112)
GFR: 73.65 mL/min (ref >60.00)
Glucose, Bld: 112 mg/dL — ABNORMAL HIGH (ref 70–99)
Potassium: 4.5 mEq/L (ref 3.5–5.1)
Sodium: 138 mEq/L (ref 135–145)
Total Bilirubin: 0.5 mg/dL (ref 0.2–1.2)
Total Protein: 7.4 g/dL (ref 6.0–8.3)

## 2014-12-20 LAB — MICROALBUMIN / CREATININE URINE RATIO
CREATININE, U: 177.4 mg/dL
MICROALB/CREAT RATIO: 0.5 mg/g (ref 0.0–30.0)
Microalb, Ur: 0.9 mg/dL (ref 0.0–1.9)

## 2014-12-20 LAB — LIPID PANEL
Cholesterol: 160 mg/dL (ref 0–200)
HDL: 59.3 mg/dL (ref >39.00)
LDL CALC: 88 mg/dL (ref 0–99)
NonHDL: 100.7
Total CHOL/HDL Ratio: 3
Triglycerides: 65 mg/dL (ref 0.0–149.0)
VLDL: 13 mg/dL (ref 0.0–40.0)

## 2014-12-20 LAB — TSH: TSH: 0.32 u[IU]/mL — ABNORMAL LOW (ref 0.35–4.50)

## 2014-12-20 MED ORDER — TRAMADOL HCL 50 MG PO TABS: 50.0000 mg | ORAL_TABLET | Freq: Two times a day (BID) | ORAL | Status: DC

## 2014-12-20 MED ORDER — MELOXICAM 15 MG PO TABS: 15.0000 mg | ORAL_TABLET | Freq: Every day | ORAL | Status: DC

## 2014-12-20 NOTE — Patient Instructions (Signed)
Referral to Dr Ernest Pine is in process Please return for BP check in one month

## 2014-12-20 NOTE — Progress Notes (Signed)
Pre-visit discussion using our clinic review tool. No additional management support is needed unless otherwise documented below in the visit note.  

## 2014-12-21 ENCOUNTER — Encounter: Payer: Self-pay | Admitting: *Deleted

## 2014-12-22 ENCOUNTER — Encounter: Payer: Self-pay | Admitting: Internal Medicine

## 2014-12-22 NOTE — Assessment & Plan Note (Signed)
Conditions for continued refills on tramadol reviewed with patient.  referal to [facility] required to evaluate source of pain.  Continued tramadol up to twice daily , rx given

## 2014-12-22 NOTE — Assessment & Plan Note (Signed)
Elevated today. HOwever patient insists that hs BP has been normal at work and at home.  Asked to return in one month for BP checked. Renal function and urinalysis today were normal  Lab Results  Component Value Date   MICROALBUR 0.9 12/20/2014   Lab Results  Component Value Date   CREATININE 1.12 12/20/2014   Lab Results  Component Value Date   NA 138 12/20/2014   K 4.5 12/20/2014   CL 104 12/20/2014   CO2 30 12/20/2014

## 2014-12-22 NOTE — Progress Notes (Signed)
Subjective:  Patient ID: Jeffery Maltese Sr., male    DOB: 1966-04-16  Age: 49 y.o. MRN: [id number]  CC: The primary encounter diagnosis was Elevated blood-pressure reading without diagnosis of hypertension. Diagnoses of Thyroid disease and Bilateral chronic knee pain were also pertinent to this visit.  HPI Jeffery KUCERA Sr. presents for refill on tramadol.  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  He has tried anti inflammatory medication in the past (2013. prescribed by [facility] 7.5 mg ) which did not relieve his pain ,  He has been taking prn vicodin and daily tramadol until Apirl when his vicodin was not refilled due ot refuasl to return for UDS.  He has been using one tramadol in the morning and one in the evening after work , if needed. He has been without tramadol for nearly a week and is having a difficult time at work due to increased pain. Long discussion today about his compliance issues.  Patient states that the most recent refusal was due to being late for work due to the process taking too long.  Patient admits that he occasionally smokes marijuana to relieve his pain .  He rarely engages in use of alcohol,  Less than 4 times per year.     Outpatient Prescriptions Prior to Visit  Medication Sig Dispense Refill  . HYDROcodone-acetaminophen (NORCO) 10-325 MG per tablet Take 1 tablet by mouth daily. (Patient not taking: Reported on 12/20/2014) 30 tablet 0  . traMADol (ULTRAM) 50 MG tablet Take 1 tablet (50 mg total) by mouth every 6 (six) hours as needed. (Patient not taking: Reported on 12/20/2014) 120 tablet 0   No facility-administered medications prior to visit.    Review of Systems;  Patient denies headache, fevers, malaise, unintentional weight loss, skin rash, eye pain, sinus congestion and sinus pain, sore throat, dysphagia,  hemoptysis ,  cough, dyspnea, wheezing, chest pain, palpitations, orthopnea, edema, abdominal pain, nausea, melena, diarrhea, constipation, flank pain, dysuria, hematuria, urinary  Frequency, nocturia, numbness, tingling, seizures,  Focal weakness, Loss of consciousness,  Tremor, insomnia, depression, anxiety, and suicidal ideation.      Objective:  BP 146/98 mmHg  Pulse 73  Temp(Src) 98.2 F (36.8 C) (Oral)  Resp 14  Ht 5\' 9"  (1.753 m)  Wt 169 lb 12 oz (76.998 kg)  BMI 25.06 kg/m2  SpO2 98%  BP Readings from Last 3 Encounters:  12/20/14 146/98  07/30/14 140/82  11/23/13 142/84    Wt Readings from Last 3 Encounters:  12/20/14 169 lb 12 oz (76.998 kg)  07/30/14 169 lb (76.658 kg)  11/23/13 170 lb 8 oz (77.338 kg)    General appearance: alert, cooperative and appears stated age Neck: no adenopathy, no carotid bruit, supple, symmetrical, trachea midline and thyroid not enlarged, symmetric, no tenderness/mass/nodules Back: symmetric, no curvature. ROM normal. No CVA tenderness. Lungs: clear to auscultation bilaterally Heart: regular rate and rhythm, S1, S2 normal, no murmur, click, rub or gallop Abdomen: soft, non-tender; bowel sounds normal; no masses,  no organomegaly Pulses: 2+ and symmetric Skin: Skin color, texture, turgor normal. No rashes or lesions MSK:  Bilateral crepitus, both knees   No results found for: HGBA1C  Lab Results  Component Value Date   CREATININE 1.12 12/20/2014   CREATININE 1.1 03/21/2013    Lab Results  Component Value Date   WBC 5.4  03/21/2013   HGB 14.0 03/21/2013   HCT 40.5 03/21/2013   PLT 242.0 03/21/2013   GLUCOSE 112* 12/20/2014   CHOL 160 12/20/2014   TRIG 65.0 12/20/2014   HDL 59.30 12/20/2014   LDLCALC 88 12/20/2014   ALT 20 12/20/2014   AST 19 12/20/2014   NA 138 12/20/2014   K 4.5 12/20/2014   CL 104 12/20/2014   CREATININE 1.12 12/20/2014   BUN 14 12/20/2014   CO2 30 12/20/2014   TSH 0.32* 12/20/2014   MICROALBUR 0.9 12/20/2014     No results found.  Assessment & Plan:   Problem List Items Addressed This Visit    Thyroid disease    He did not follow up with Endocrinology last year.  Repeat TSH is 0.32.  He has subclinical hyperthyroidism  Lab Results  Component Value Date   TSH 0.32* 12/20/2014         Relevant Orders   TSH (Completed)   Bilateral chronic knee pain    Conditions for continued refills on tramadol reviewed with patient.  referal to [facility] required to evaluate source of pain.  Continued tramadol up to twice daily , rx given       Relevant Medications   traMADol (ULTRAM) 50 MG tablet   meloxicam (MOBIC) 15 MG tablet   Elevated blood-pressure reading without diagnosis of hypertension - Primary    Elevated today. HOwever patient insists that hs BP has been normal at work and at home.  Asked to return in one month for BP checked. Renal function and urinalysis today were normal  Lab Results  Component Value Date   MICROALBUR 0.9 12/20/2014   Lab Results  Component Value Date   CREATININE 1.12 12/20/2014   Lab Results  Component Value Date   NA 138 12/20/2014   K 4.5 12/20/2014   CL 104 12/20/2014   CO2 30 12/20/2014         Relevant Orders   Lipid panel (Completed)   Comprehensive metabolic panel (Completed)   Microalbumin / creatinine urine ratio (Completed)    A total of 40 minutes was spent with patient more than half of which was spent in counseling patient on the above mentioned issues  and coordination of care.  I have discontinued Jeffery Horton HYDROcodone-acetaminophen. I have also changed his traMADol. Additionally, I am having him start on meloxicam.  Meds ordered this encounter  Medications  . traMADol (ULTRAM) 50 MG tablet    Sig: Take 1 tablet (50 mg total) by mouth 2 (two) times daily. As needed for knee pain    Dispense:  60 tablet    Refill:  0  . meloxicam (MOBIC) 15 MG tablet    Sig: Take 1 tablet (15 mg total) by mouth daily.     Dispense:  90 tablet    Refill:  0    Medications Discontinued During This Encounter  Medication Reason  . HYDROcodone-acetaminophen (NORCO) 10-325 MG per tablet   . traMADol (ULTRAM) 50 MG tablet Reorder    Follow-up: No Follow-up on file.   Sherlene Shams, MD

## 2014-12-22 NOTE — Assessment & Plan Note (Addendum)
He did not follow up with Endocrinology last year.  Repeat TSH is 0.32.  He has subclinical hyperthyroidism  Lab Results  Component Value Date   TSH 0.32* 12/20/2014

## 2014-12-22 NOTE — Addendum Note (Signed)
Addended by: Sherlene Shams on: 12/22/2014 04:10 PM   Modules accepted: Orders

## 2015-01-04 ENCOUNTER — Telehealth: Payer: Self-pay | Admitting: *Deleted

## 2015-01-04 MED ORDER — TRAMADOL HCL 50 MG PO TABS: 50.0000 mg | ORAL_TABLET | Freq: Three times a day (TID) | ORAL | Status: DC | PRN

## 2015-01-04 NOTE — Telephone Encounter (Signed)
Rx faxed

## 2015-01-04 NOTE — Telephone Encounter (Signed)
Ok to refill early ,  rx changed to three times daily .

## 2015-01-04 NOTE — Telephone Encounter (Signed)
Pt called states his appointment with the Orthopedic is not until the end of July, not sure of the exact date.  He is requesting Tramadol refill.  Pt was advised he should have enough medication to last until 7.16.16.  pt state he will run out by the end of next week.  Please advise

## 2015-02-26 ENCOUNTER — Telehealth: Payer: Self-pay | Admitting: *Deleted

## 2015-02-26 MED ORDER — TRAMADOL HCL 50 MG PO TABS: 50.0000 mg | ORAL_TABLET | Freq: Two times a day (BID) | ORAL | Status: DC

## 2015-02-26 MED ORDER — TRAMADOL HCL 50 MG PO TABS: 50.0000 mg | ORAL_TABLET | Freq: Three times a day (TID) | ORAL | Status: DC | PRN

## 2015-02-26 NOTE — Telephone Encounter (Signed)
Pt aware Rx to be faxed on 8.24.16.

## 2015-02-26 NOTE — Telephone Encounter (Signed)
Ok to refill,  printed rx .  Discard the first one that has no refills.  Second one with refills is the intended one.

## 2015-02-26 NOTE — Telephone Encounter (Signed)
Pt called requesting Tramadol refill.  Last OV 6.16.16, last refill 7.1.16.  Please advise refill

## 2015-06-19 ENCOUNTER — Other Ambulatory Visit: Payer: Self-pay | Admitting: Internal Medicine

## 2015-06-19 NOTE — Telephone Encounter (Signed)
Refill due after dec 23 printed

## 2015-06-19 NOTE — Telephone Encounter (Signed)
Please advise 

## 2015-06-21 ENCOUNTER — Encounter: Payer: Self-pay | Admitting: Internal Medicine

## 2015-06-21 ENCOUNTER — Ambulatory Visit (INDEPENDENT_AMBULATORY_CARE_PROVIDER_SITE_OTHER): Payer: PRIVATE HEALTH INSURANCE | Admitting: Internal Medicine

## 2015-06-21 ENCOUNTER — Ambulatory Visit: Payer: PRIVATE HEALTH INSURANCE | Admitting: Family Medicine

## 2015-06-21 VITALS — BP 162/102 | HR 78 | Temp 98.0°F | Wt 167.0 lb

## 2015-06-21 DIAGNOSIS — R030 Elevated blood-pressure reading, without diagnosis of hypertension: Secondary | ICD-10-CM | POA: Diagnosis not present

## 2015-06-21 DIAGNOSIS — F411 Generalized anxiety disorder: Secondary | ICD-10-CM

## 2015-06-21 DIAGNOSIS — F419 Anxiety disorder, unspecified: Secondary | ICD-10-CM | POA: Diagnosis not present

## 2015-06-21 DIAGNOSIS — F430 Acute stress reaction: Principal | ICD-10-CM

## 2015-06-21 MED ORDER — ESCITALOPRAM OXALATE 10 MG PO TABS
10.0000 mg | ORAL_TABLET | Freq: Every day | ORAL | Status: AC
Start: 2015-06-21 — End: ?

## 2015-06-21 MED ORDER — ALPRAZOLAM 0.5 MG PO TABS: 0.5000 mg | ORAL_TABLET | Freq: Every evening | ORAL | Status: DC | PRN

## 2015-06-21 NOTE — Progress Notes (Signed)
Pre visit review using our clinic review tool, if applicable. No additional management support is needed unless otherwise documented below in the visit note. 
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
## 2015-06-21 NOTE — Patient Instructions (Addendum)
I am starting you on generic Lexapro to take daily for your anxiety and depression.  This is an SSRI;  Not a sedative, but it may make you a little sleepy,  So...  You should take it the same time every day with dinner. Start with 1/2 a tablet for the first few days .  If it makes it harder to sleep,  Change to morning dosing with breakfast.  Increase to a full tablet daily after 4 days    I am prescribing a mild sedative (generic Xanax) called alprazolam to use at night to help you unwind and fall asleep .  It works in about 15-30 minutes   And lasts about 4 to 6 hours.     Here are a couple of good ready made meals that stay frozen until needed:   Caprice Red[employer] Angelo  Makes excellent eggplant and chicken parmigiano and lasagna  Thelma [employer] Calender makes excellent chicken pot pies   dont' forget about rotisserie chickens!   Every supermarket does them now.  Just add salad and a green vegetable

## 2015-06-23 ENCOUNTER — Encounter: Payer: Self-pay | Admitting: Internal Medicine

## 2015-06-23 DIAGNOSIS — F411 Generalized anxiety disorder: Secondary | ICD-10-CM | POA: Insufficient documentation

## 2015-06-23 DIAGNOSIS — F430 Acute stress reaction: Principal | ICD-10-CM | POA: Insufficient documentation
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
## 2015-06-23 NOTE — Assessment & Plan Note (Signed)
Today's elevation likely due to his emotional state.  If still elevated at follow up will start medication

## 2015-07-11 ENCOUNTER — Encounter: Payer: Self-pay | Admitting: Urgent Care

## 2015-07-11 ENCOUNTER — Emergency Department
Admission: EM | Admit: 2015-07-11 | Discharge: 2015-07-11 | Disposition: A | Discharge status: Home / Self Care | Payer: 59 | Attending: Emergency Medicine | Admitting: Emergency Medicine

## 2015-07-11 ENCOUNTER — Emergency Department: Payer: 59

## 2015-07-11 DIAGNOSIS — R109 Unspecified abdominal pain: Secondary | ICD-10-CM | POA: Diagnosis present

## 2015-07-11 DIAGNOSIS — N200 Calculus of kidney: Secondary | ICD-10-CM | POA: Insufficient documentation

## 2015-07-11 DIAGNOSIS — Z79899 Other long term (current) drug therapy: Secondary | ICD-10-CM | POA: Diagnosis not present

## 2015-07-11 DIAGNOSIS — Z87891 Personal history of nicotine dependence: Secondary | ICD-10-CM | POA: Insufficient documentation

## 2015-07-11 LAB — URINALYSIS COMPLETE WITH MICROSCOPIC (ARMC ONLY)
Bacteria, UA: NONE SEEN
Bilirubin Urine: NEGATIVE
Glucose, UA: NEGATIVE mg/dL
KETONES UR: NEGATIVE mg/dL
LEUKOCYTES UA: NEGATIVE
NITRITE: NEGATIVE
PROTEIN: NEGATIVE mg/dL
SPECIFIC GRAVITY, URINE: 1.004 — AB (ref 1.005–1.030)
WBC UA: NONE SEEN WBC/hpf (ref 0–5)
pH: 5 (ref 5.0–8.0)

## 2015-07-11 LAB — COMPREHENSIVE METABOLIC PANEL
ALT: 18 U/L (ref 17–63)
ANION GAP: 7 (ref 5–15)
AST: 17 U/L (ref 15–41)
Albumin: 4.6 g/dL (ref 3.5–5.0)
Alkaline Phosphatase: 47 U/L (ref 38–126)
BUN: 14 mg/dL (ref 6–20)
CO2: 26 mmol/L (ref 22–32)
CREATININE: 1.4 mg/dL — AB (ref 0.61–1.24)
Calcium: 9.6 mg/dL (ref 8.9–10.3)
Chloride: 104 mmol/L (ref 101–111)
GFR, EST NON AFRICAN AMERICAN: 57 mL/min — AB (ref >60)
Glucose, Bld: 128 mg/dL — ABNORMAL HIGH (ref 65–99)
POTASSIUM: 3.7 mmol/L (ref 3.5–5.1)
SODIUM: 137 mmol/L (ref 135–145)
Total Bilirubin: 1.7 mg/dL — ABNORMAL HIGH (ref 0.3–1.2)
Total Protein: 7.3 g/dL (ref 6.5–8.1)

## 2015-07-11 LAB — CBC WITH DIFFERENTIAL/PLATELET
Basophils Absolute: 0 10*3/uL (ref 0–0.1)
Basophils Relative: 0 %
EOS ABS: 0.1 10*3/uL (ref 0–0.7)
EOS PCT: 0 %
HCT: 43.3 % (ref 40.0–52.0)
Hemoglobin: 15.4 g/dL (ref 13.0–18.0)
LYMPHS ABS: 0.7 10*3/uL — AB (ref 1.0–3.6)
LYMPHS PCT: 6 %
MCH: 30.7 pg (ref 26.0–34.0)
MCHC: 35.5 g/dL (ref 32.0–36.0)
MCV: 86.4 fL (ref 80.0–100.0)
MONO ABS: 1 10*3/uL (ref 0.2–1.0)
Monocytes Relative: 9 %
Neutro Abs: 10 10*3/uL — ABNORMAL HIGH (ref 1.4–6.5)
Neutrophils Relative %: 85 %
PLATELETS: 238 10*3/uL (ref 150–440)
RBC: 5.01 MIL/uL (ref 4.40–5.90)
RDW: 12.7 % (ref 11.5–14.5)
WBC: 11.8 10*3/uL — ABNORMAL HIGH (ref 3.8–10.6)

## 2015-07-11 LAB — LIPASE, BLOOD: LIPASE: 47 U/L (ref 11–51)

## 2015-07-11 MED ORDER — TAMSULOSIN HCL 0.4 MG PO CAPS: 0.4000 mg | ORAL_CAPSULE | Freq: Every day | ORAL | Status: AC

## 2015-07-11 MED ORDER — KETOROLAC TROMETHAMINE 30 MG/ML IJ SOLN
30.0000 mg | Freq: Once | INTRAMUSCULAR | Status: AC
  Administered 2015-07-11: 30 mg via INTRAVENOUS
  Filled 2015-07-11: qty 1

## 2015-07-11 MED ORDER — SODIUM CHLORIDE 0.9 % IV BOLUS (SEPSIS)
1000.0000 mL | Freq: Once | INTRAVENOUS | Status: AC
  Administered 2015-07-11: 1000 mL via INTRAVENOUS

## 2015-07-11 MED ORDER — MORPHINE SULFATE (PF) 4 MG/ML IV SOLN
INTRAVENOUS | Status: AC
  Administered 2015-07-11: 4 mg via INTRAVENOUS
  Filled 2015-07-11: qty 1

## 2015-07-11 MED ORDER — MORPHINE SULFATE (PF) 4 MG/ML IV SOLN
4.0000 mg | Freq: Once | INTRAVENOUS | Status: AC
  Administered 2015-07-11: 4 mg via INTRAVENOUS

## 2015-07-11 MED ORDER — OXYCODONE-ACETAMINOPHEN 5-325 MG PO TABS: 1.0000 | ORAL_TABLET | ORAL | Status: AC | PRN

## 2015-07-11 MED ORDER — ONDANSETRON HCL 4 MG/2ML IJ SOLN
INTRAMUSCULAR | Status: AC
  Administered 2015-07-11: 4 mg via INTRAVENOUS
  Filled 2015-07-11: qty 2

## 2015-07-11 MED ORDER — ONDANSETRON 4 MG PO TBDP: 4.0000 mg | ORAL_TABLET | Freq: Three times a day (TID) | ORAL | Status: AC | PRN

## 2015-07-11 MED ORDER — ONDANSETRON HCL 4 MG/2ML IJ SOLN
4.0000 mg | Freq: Once | INTRAMUSCULAR | Status: AC
  Administered 2015-07-11: 4 mg via INTRAVENOUS

## 2015-07-11 NOTE — Discharge Instructions (Signed)
Kidney Stones °Kidney stones (urolithiasis) are deposits that form inside your kidneys. The intense pain is caused by the stone moving through the urinary tract. When the stone moves, the ureter goes into spasm around the stone. The stone is usually passed in the urine.  °CAUSES  °· A disorder that makes certain neck glands produce too much parathyroid hormone (primary hyperparathyroidism). °· A buildup of uric acid crystals, similar to gout in your joints. °· Narrowing (stricture) of the ureter. °· A kidney obstruction present at birth (congenital obstruction). °· Previous surgery on the kidney or ureters. °· Numerous kidney infections. °SYMPTOMS  °· Feeling sick to your stomach (nauseous). °· Throwing up (vomiting). °· Blood in the urine (hematuria). °· Pain that usually spreads (radiates) to the groin. °· Frequency or urgency of urination. °DIAGNOSIS  °· Taking a history and physical exam. °· Blood or urine tests. °· CT scan. °· Occasionally, an examination of the inside of the urinary bladder (cystoscopy) is performed. °TREATMENT  °· Observation. °· Increasing your fluid intake. °· Extracorporeal shock wave lithotripsy--This is a noninvasive procedure that uses shock waves to break up kidney stones. °· Surgery may be needed if you have severe pain or persistent obstruction. There are various surgical procedures. Most of the procedures are performed with the use of small instruments. Only small incisions are needed to accommodate these instruments, so recovery time is minimized. °The size, location, and chemical composition are all important variables that will determine the proper choice of action for you. Talk to your health care provider to better understand your situation so that you will minimize the risk of injury to yourself and your kidney.  °HOME CARE INSTRUCTIONS  °· Drink enough water and fluids to keep your urine clear or pale yellow. This will help you to pass the stone or stone fragments. °· Strain  all urine through the provided strainer. Keep all particulate matter and stones for your health care provider to see. The stone causing the pain may be as small as a grain of salt. It is very important to use the strainer each and every time you pass your urine. The collection of your stone will allow your health care provider to analyze it and verify that a stone has actually passed. The stone analysis will often identify what you can do to reduce the incidence of recurrences. °· Only take over-the-counter or prescription medicines for pain, discomfort, or fever as directed by your health care provider. °· Keep all follow-up visits as told by your health care provider. This is important. °· Get follow-up X-rays if required. The absence of pain does not always mean that the stone has passed. It may have only stopped moving. If the urine remains completely obstructed, it can cause loss of kidney function or even complete destruction of the kidney. It is your responsibility to make sure X-rays and follow-ups are completed. Ultrasounds of the kidney can show blockages and the status of the kidney. Ultrasounds are not associated with any radiation and can be performed easily in a matter of minutes. °· Make changes to your daily diet as told by your health care provider. You may be told to: °¨ Limit the amount of salt that you eat. °¨ Eat 5 or more servings of fruits and vegetables each day. °¨ Limit the amount of meat, poultry, fish, and eggs that you eat. °· Collect a 24-hour urine sample as told by your health care provider. You may need to collect another urine sample every 6-12   months. °SEEK MEDICAL CARE IF: °· You experience pain that is progressive and unresponsive to any pain medicine you have been prescribed. °SEEK IMMEDIATE MEDICAL CARE IF:  °· Pain cannot be controlled with the prescribed medicine. °· You have a fever or shaking chills. °· The severity or intensity of pain increases over 18 hours and is not  relieved by pain medicine. °· You develop a new onset of abdominal pain. °· You feel faint or pass out. °· You are unable to urinate. °  °This information is not intended to replace advice given to you by your health care provider. Make sure you discuss any questions you have with your health care provider. °  °Document Released: 06/22/2005 Document Revised: 03/13/2015 Document Reviewed: 11/23/2012 °Elsevier Interactive Patient Education ©2016 [employer] ° °

## 2015-07-11 NOTE — ED Provider Notes (Signed)
[facility] Emergency Department Provider Note  ____________________________________________  Time seen: 4:00 AM  I have reviewed the triage vital signs and the nursing notes.   HISTORY  Chief Complaint Nephrolithiasis     HPI Jeffery Maltesenthony J Dedman Sr. is a 50 y.o. male presents with acute onset of left flank pain at 4 AM yesterday accompanied by nausea and vomiting. Patient states the current pain score is 9 out of 10. Patient denies any dysuria no hematuria no urinary frequency or urgency.    Past Medical History  Diagnosis Date  . Renal calculi 2009  . Thyroid disease     hyperthyroidism    Patient Active Problem List   Diagnosis Date Noted  . Anxiety as acute reaction to exceptional stress 06/23/2015  . Elevated blood-pressure reading without diagnosis of hypertension 12/20/2014  . Plantar fasciitis, left 03/22/2013  . Bilateral chronic knee pain 09/02/2011  . Renal calculi   . Thyroid disease     Past Surgical History  Procedure Laterality Date  . 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 ALPRAZolam (XANAX) 0.5 MG tablet   Oral   Take 1 tablet (0.5 mg total) by mouth at bedtime as needed for anxiety.   30 tablet   3   . escitalopram (LEXAPRO) 10 MG tablet   Oral   Take 1 tablet (10 mg total) by mouth daily.   30 tablet   3   . traMADol (ULTRAM) 50 MG tablet      TAKE 1 TABLET BY MOUTH TWICE A DAY AS NEEDED FOR KNEE PAIN   60 tablet   1     May refill on or after Jun 28 2015     Allergies No known drug allergies  Family History  Problem Relation Age of Onset  . Cancer Neg Hx     Social History Social History  Substance Use Topics  . Smoking status: Former Smoker -- 1.00 packs/day    Types: Cigarettes    Quit date: 08/02/2011  . Smokeless tobacco: Never Used  . Alcohol Use: No    Review of  Systems  Constitutional: Negative for fever. Eyes: Negative for visual changes. ENT: Negative for sore throat. Cardiovascular: Negative for chest pain. Respiratory: Negative for shortness of breath. Gastrointestinal: Positive for left flank pain Genitourinary: Negative for dysuria. Musculoskeletal: Negative for back pain. Skin: Negative for rash. Neurological: Negative for headaches, focal weakness or numbness.   10-point ROS otherwise negative.  ____________________________________________   PHYSICAL EXAM:  VITAL SIGNS: ED Triage Vitals  Enc Vitals Group     BP 07/11/15 0342 146/86 mmHg     Pulse Rate 07/11/15 0342 80     Resp 07/11/15 0342 18     Temp 07/11/15 0342 98.9 F (37.2 C)     Temp Source 07/11/15 0342 Oral     SpO2 07/11/15 0342 98 %     Weight 07/11/15 0342 170 lb (77.111 kg)     Height 07/11/15 0342 5\' 9"  (1.753 m)     Head Cir --      Peak Flow --      Pain Score 07/11/15 0342 9     Pain Loc --      Pain Edu? --      Excl. in GC? --      Constitutional: Alert and  oriented. Apparent discomfort Eyes: Conjunctivae are normal. PERRL. Normal extraocular movements. ENT   Head: Normocephalic and atraumatic.   Nose: No congestion/rhinnorhea.   Mouth/Throat: Mucous membranes are moist.   Neck: No stridor. Hematological/Lymphatic/Immunilogical: No cervical lymphadenopathy. Cardiovascular: Normal rate, regular rhythm. Normal and symmetric distal pulses are present in all extremities. No murmurs, rubs, or gallops. Respiratory: Normal respiratory effort without tachypnea nor retractions. Breath sounds are clear and equal bilaterally. No wheezes/rales/rhonchi. Gastrointestinal: Soft and nontender. No distention. There is no CVA tenderness. Genitourinary: deferred Musculoskeletal: Nontender with normal range of motion in all extremities. No joint effusions.  No lower extremity tenderness nor edema. Neurologic:  Normal speech and language. No gross  focal neurologic deficits are appreciated. Speech is normal.  Skin:  Skin is warm, dry and intact. No rash noted. Psychiatric: Mood and affect are normal. Speech and behavior are normal. Patient exhibits appropriate insight and judgment.  ____________________________________________    LABS (pertinent positives/negatives)  Labs Reviewed  CBC WITH DIFFERENTIAL/PLATELET - Abnormal; Notable for the following:    WBC 11.8 (*)    Neutro Abs 10.0 (*)    Lymphs Abs 0.7 (*)    All other components within normal limits  COMPREHENSIVE METABOLIC PANEL - Abnormal; Notable for the following:    Glucose, Bld 128 (*)    Creatinine, Ser 1.40 (*)    Total Bilirubin 1.7 (*)    GFR calc non Af Amer 57 (*)    All other components within normal limits  URINALYSIS COMPLETEWITH MICROSCOPIC ([facility] ONLY) - Abnormal; Notable for the following:    Color, Urine STRAW (*)    APPearance CLEAR (*)    Specific Gravity, Urine 1.004 (*)    Hgb urine dipstick 2+ (*)    Squamous Epithelial / LPF 0-5 (*)    All other components within normal limits  LIPASE, BLOOD     RADIOLOGY     CT RENAL STONE STUDY (Final result) Result time: 07/11/15 05:09:45   Final result by Rad Results In Interface (07/11/15 05:09:45)   Narrative:   CLINICAL DATA: Acute onset of left-sided abdominal pain and nausea. Initial encounter.  EXAM: CT ABDOMEN AND PELVIS WITHOUT CONTRAST  TECHNIQUE: Multidetector CT imaging of the abdomen and pelvis was performed following the standard protocol without IV contrast.  COMPARISON: None.  FINDINGS: The visualized lung bases are clear.  The liver and spleen are unremarkable in appearance. The gallbladder is within normal limits. The pancreas is unremarkable in appearance. There is chronic calcification at the right adrenal gland, reflecting remote injury. The left adrenal gland is grossly unremarkable.  There is minimal left-sided hydronephrosis, with mild prominence of the  left ureter along its entire course. An obstructing 4 mm stone is noted distally at the left vesicoureteral junction. Left-sided perinephric stranding and fluid are noted. A 2 mm nonobstructing stone is noted at the lower pole of the right kidney.  No free fluid is identified. The small bowel is unremarkable in appearance. The stomach is within normal limits. No acute vascular abnormalities are seen.  The appendix is normal in caliber and contains air, without evidence of appendicitis. The colon is unremarkable in appearance.  The bladder is mildly distended and grossly remarkable. The prostate remains normal in size. No inguinal lymphadenopathy is seen.  No acute osseous abnormalities are identified. 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  IMPRESSION: 1. Minimal left-sided hydronephrosis, with an obstructing 4 mm stone noted distally at the left vesicoureteral junction. Left-sided perinephric  stranding and fluid noted. 2. 2 mm nonobstructing stone at the lower pole of the right kidney. 3. 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   Electronically Signed By: Roanna Raider M.D. On: 07/11/2015 05:09         INITIAL IMPRESSION / ASSESSMENT AND PLAN / ED COURSE  Pertinent labs & imaging results that were available during my care of the patient were reviewed by me and considered in my medical decision making (see chart for details).  History physical exam and CT scan findings consistent with left sided ureterolithiasis patient received morphine and Toradol as well as Zofran in the emergency department with resolution of symptoms  ____________________________________________   FINAL CLINICAL IMPRESSION(S) / ED DIAGNOSES  Final diagnoses:  Kidney stone on left side      Darci Current, MD 07/11/15 [phone]

## 2015-07-11 NOTE — ED Notes (Signed)
Patient presents with LEFT flank plain that began around 1600 yesterday. (+) N/V. Denies difficulty with urination.

## 2015-07-11 NOTE — ED Notes (Signed)
Pt presents with L sided pain. Pt has hx of kidney stones, states pain is similar. Pt states becoming nauseous. Denies difficulty urinating or pain with urination. States pain started at 4p yesterday.

## 2015-07-12 ENCOUNTER — Ambulatory Visit (INDEPENDENT_AMBULATORY_CARE_PROVIDER_SITE_OTHER): Payer: PRIVATE HEALTH INSURANCE | Admitting: Internal Medicine

## 2015-07-12 ENCOUNTER — Encounter: Payer: Self-pay | Admitting: Internal Medicine

## 2015-07-12 VITALS — BP 160/98 | HR 72 | Temp 98.3°F | Resp 12 | Ht 69.0 in | Wt 165.5 lb

## 2015-07-12 DIAGNOSIS — F411 Generalized anxiety disorder: Secondary | ICD-10-CM

## 2015-07-12 DIAGNOSIS — N201 Calculus of ureter: Secondary | ICD-10-CM | POA: Diagnosis not present

## 2015-07-12 DIAGNOSIS — F419 Anxiety disorder, unspecified: Secondary | ICD-10-CM | POA: Diagnosis not present

## 2015-07-12 DIAGNOSIS — R030 Elevated blood-pressure reading, without diagnosis of hypertension: Secondary | ICD-10-CM | POA: Diagnosis not present

## 2015-07-12 DIAGNOSIS — F430 Acute stress reaction: Secondary | ICD-10-CM

## 2015-07-12 MED ORDER — TAMSULOSIN HCL 0.4 MG PO CAPS: 0.4000 mg | ORAL_CAPSULE | Freq: Every day | ORAL | Status: DC

## 2015-07-12 MED ORDER — ALPRAZOLAM 0.5 MG PO TABS: 0.5000 mg | ORAL_TABLET | Freq: Two times a day (BID) | ORAL | Status: DC | PRN

## 2015-07-12 NOTE — Progress Notes (Signed)
Subjective:  Patient ID: Jeffery Mac Sr., male    DOB: 1965-08-26  Age: 50 y.o. MRN: [id number]  CC: The primary encounter diagnosis was Ureteral calculi. Diagnoses of Elevated blood-pressure reading without diagnosis of hypertension and Anxiety as acute reaction to exceptional stress were also pertinent to this visit.  HPI Jeffery KUCH Sr. presents for follow up on 1) anxiety as an acute reaction to his wife's estrangement. 2)  Elevated blood pressure with no history of hypertension 3) ED visit for nephrolithiasis .    He was treated in ED yesterday for left  sided flank pain accompanied by nausea and vomiting and underwent CT imaging which revealed a  4 mm obstructing stone at the right vesicoureteral junction,  with peroinephric stranding and minimal hydronephrosis.  He was treated with morphine, toradol and zofran with resolution of symptoms and given a 5 day rx for  flomax and oxycodone.  He has had stones before  Does not recall any recent episdes of decreased hydration ,  And states that although he is not aware of passing the stone, his pain is somewhat better and he has only used the oxycodone once.  Denies fevers and nausea.   2) Anxiety:  Tolerating lexapro and using xanax at night only to manage insomnia.  Has met with an attorney, and is feeling beter since the arrival of his mother from [place] who has come to help him take care of his disabled son. Still having some unmanaged anxiety during the day, has not used an xanax during he day.   3) elevated bp..  He has not checked his BP yet at home but reports that he is anxious today because of his wife's impending visit .   Outpatient Prescriptions Prior to Visit  Medication Sig Dispense Refill  . escitalopram (LEXAPRO) 10 MG tablet Take 1 tablet (10 mg total) by mouth daily. 30 tablet 3  . ondansetron (ZOFRAN ODT) 4 MG disintegrating tablet Take 1 tablet (4 mg total) by mouth every 8 (eight) hours as needed for  nausea or vomiting. 20 tablet 0  . oxyCODONE-acetaminophen (ROXICET) 5-325 MG tablet Take 1 tablet by mouth every 4 (four) hours as needed for severe pain. 20 tablet 0  . tamsulosin (FLOMAX) 0.4 MG CAPS capsule Take 1 capsule (0.4 mg total) by mouth daily after breakfast. 5 capsule 0  . traMADol (ULTRAM) 50 MG tablet TAKE 1 TABLET BY MOUTH TWICE A DAY AS NEEDED FOR KNEE PAIN 60 tablet 1  . ALPRAZolam (XANAX) 0.5 MG tablet Take 1 tablet (0.5 mg total) by mouth at bedtime as needed for anxiety. 30 tablet 3   No facility-administered medications prior to visit.    Review of Systems;  Patient denies headache, fevers, malaise, unintentional weight loss, skin rash, eye pain, sinus congestion and sinus pain, sore throat, dysphagia,  hemoptysis , cough, dyspnea, wheezing, chest pain, palpitations, orthopnea, edema, abdominal pain, nausea, melena, diarrhea, constipation, flank pain, dysuria, hematuria, urinary  Frequency, nocturia, numbness, tingling, seizures,  Focal weakness, Loss of consciousness,  Tremor, insomnia, depression, anxiety, and suicidal ideation.      Objective:  BP 160/98 mmHg  Pulse 72  Temp(Src) 98.3 F (36.8 C) (Oral)  Resp 12  Ht 5' 9"  (1.753 m)  Wt 165 lb 8 oz (75.07 kg)  BMI 24.43 kg/m2  SpO2 99%  BP Readings from Last 3 Encounters:  07/12/15 160/98  07/11/15 159/92  06/21/15 162/102    Wt Readings from Last 3 Encounters:  07/12/15  165 lb 8 oz (75.07 kg)  07/11/15 170 lb (77.111 kg)  06/21/15 167 lb (75.751 kg)    General appearance: alert, cooperative and appears stated age Back: symmetric, no curvature. ROM normal. No CVA tenderness. Lungs: clear to auscultation bilaterally Heart: regular rate and rhythm, S1, S2 normal, no murmur, click, rub or gallop Abdomen: soft, non-tender; bowel sounds normal; no masses,  no organomegaly Pulses: 2+ and symmetric Skin: Skin color, texture, turgor normal. No rashes or lesions Lymph nodes: Cervical, supraclavicular, and  axillary nodes normal. Psych: affect normal, makes good eye contact. No fidgeting,  Smiles easily.  Denies suicidal thoughts   No results found for: HGBA1C  Lab Results  Component Value Date   CREATININE 1.40* 07/11/2015   CREATININE 1.12 12/20/2014   CREATININE 1.1 03/21/2013    Lab Results  Component Value Date   WBC 11.8* 07/11/2015   HGB 15.4 07/11/2015   HCT 43.3 07/11/2015   PLT 238 07/11/2015   GLUCOSE 128* 07/11/2015   CHOL 160 12/20/2014   TRIG 65.0 12/20/2014   HDL 59.30 12/20/2014   LDLCALC 88 12/20/2014   ALT 18 07/11/2015   AST 17 07/11/2015   NA 137 07/11/2015   K 3.7 07/11/2015   CL 104 07/11/2015   CREATININE 1.40* 07/11/2015   BUN 14 07/11/2015   CO2 26 07/11/2015   TSH 0.32* 12/20/2014   MICROALBUR 0.9 12/20/2014    Ct Renal Stone Study  07/11/2015  CLINICAL DATA:  Acute onset of left-sided abdominal pain and nausea. Initial encounter. EXAM: CT ABDOMEN AND PELVIS WITHOUT CONTRAST TECHNIQUE: Multidetector CT imaging of the abdomen and pelvis was performed following the standard protocol without IV contrast. COMPARISON:  None. FINDINGS: The visualized lung bases are clear. The liver and spleen are unremarkable in appearance. The gallbladder is within normal limits. The pancreas is unremarkable in appearance. There is chronic calcification at the right adrenal gland, reflecting remote injury. The left adrenal gland is grossly unremarkable. There is minimal left-sided hydronephrosis, with mild prominence of the left ureter along its entire course. An obstructing 4 mm stone is noted distally at the left vesicoureteral junction. Left-sided perinephric stranding and fluid are noted. A 2 mm nonobstructing stone is noted at the lower pole of the right kidney. No free fluid is identified. The small bowel is unremarkable in appearance. The stomach is within normal limits. No acute vascular abnormalities are seen. The appendix is normal in caliber and contains air, without  evidence of appendicitis. The colon is unremarkable in appearance. The bladder is mildly distended and grossly remarkable. The prostate remains normal in size. No inguinal lymphadenopathy is seen. No acute osseous abnormalities are identified. 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 IMPRESSION: 1. Minimal left-sided hydronephrosis, with an obstructing 4 mm stone noted distally at the left vesicoureteral junction. Left-sided perinephric stranding and fluid noted. 2. 2 mm nonobstructing stone at the lower pole of the right kidney. 3. 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 Electronically Signed   By: Garald Balding M.D.   On: 07/11/2015 05:09    Assessment & Plan:   Problem List Items Addressed This Visit    Ureteral calculi - Primary    4 mm obstructing stone at the left vesicoureteral junction, minimal hydroneprhosis with perinephtic stranding noted.  flomax rx refilled , oxycodone refilled.  Urged to strain urine . Return to ER if left sided flank pain  escalates or if he develops fevers, nausea or  gross hematuria       Elevated blood-pressure reading without diagnosis of hypertension    Untreated thus far given extenuated circumstances with each reading. He has no prior history of hypertension. He will check his blood pressure several times over the next 3-4 weeks and to submit readings for evaluation. Urine microalbumin to creatinine ratio done in June was  normal.  Lab Results  Component Value Date   MICROALBUR 0.9 12/20/2014         Anxiety as acute reaction to exceptional stress    Improved with trial of lexapro and prn xanax.  Advised to use xanax bid prn, rx rewritten       Relevant Medications   ALPRAZolam (XANAX) 0.5 MG tablet     A total of 25 minutes of face to face time was spent with patient more than half of which was spent in counselling about the above mentioned conditions  and coordination of  care  I have changed Jeffery Horton ALPRAZolam. I am also having him start on tamsulosin. Additionally, I am having him maintain his traMADol, escitalopram, oxyCODONE-acetaminophen, ondansetron, and tamsulosin.  Meds ordered this encounter  Medications  . ALPRAZolam (XANAX) 0.5 MG tablet    Sig: Take 1 tablet (0.5 mg total) by mouth 2 (two) times daily as needed for anxiety.    Dispense:  60 tablet    Refill:  3  . tamsulosin (FLOMAX) 0.4 MG CAPS capsule    Sig: Take 1 capsule (0.4 mg total) by mouth daily.    Dispense:  30 capsule    Refill:  0    Medications Discontinued During This Encounter  Medication Reason  . ALPRAZolam (XANAX) 0.5 MG tablet Reorder    Follow-up: Return in about 3 months (around 10/10/2015).   Crecencio Mc, MD

## 2015-07-12 NOTE — Progress Notes (Signed)
Pre-visit discussion using our clinic review tool. No additional management support is needed unless otherwise documented below in the visit note.  

## 2015-07-12 NOTE — Patient Instructions (Addendum)
You can use 1/2 xanax during day if needed,   and up to full tablet at night for insomnia.  I have given you a new prescription for #60 /month ,  Use up your current bottle first.   Continue the lexapro  (escitalopram)  every day ;  This is safer for  long term use than the xanax   flomax is helping pass your stone .  I have given you a refill in case it doesn't pass by Monday   Get your blood pressure checked at your pharmacy when you are not upset or anxious;  Send me a message about your readings.  Untreated high blood pressure increases your risk for strokes ,  Heart attacks and kidney failure  Hypertension Hypertension, commonly called high blood pressure, is when the force of blood pumping through your arteries is too strong. Your arteries are the blood vessels that carry blood from your heart throughout your body. A blood pressure reading consists of a higher number over a lower number, such as 110/72. The higher number (systolic) is the pressure inside your arteries when your heart pumps. The lower number (diastolic) is the pressure inside your arteries when your heart relaxes. Ideally you want your blood pressure below 120/80. Hypertension forces your heart to work harder to pump blood. Your arteries may become narrow or stiff. Having untreated or uncontrolled hypertension can cause heart attack, stroke, kidney disease, and other problems. RISK FACTORS Some risk factors for high blood pressure are controllable. Others are not.  Risk factors you cannot control include:   Race. You may be at higher risk if you are African American.  Age. Risk increases with age.  Gender. Men are at higher risk than women before age 51 years. After age 51, women are at higher risk than men. Risk factors you can control include:  Not getting enough exercise or physical activity.  Being overweight.  Getting too much fat, sugar, calories, or salt in your diet.  Drinking too much alcohol. SIGNS AND  SYMPTOMS Hypertension does not usually cause signs or symptoms. Extremely high blood pressure (hypertensive crisis) may cause headache, anxiety, shortness of breath, and nosebleed. DIAGNOSIS To check if you have hypertension, your health care provider will measure your blood pressure while you are seated, with your arm held at the level of your heart. It should be measured at least twice using the same arm. Certain conditions can cause a difference in blood pressure between your right and left arms. A blood pressure reading that is higher than normal on one occasion does not mean that you need treatment. If it is not clear whether you have high blood pressure, you may be asked to return on a different day to have your blood pressure checked again. Or, you may be asked to monitor your blood pressure at home for 1 or more weeks. TREATMENT Treating high blood pressure includes making lifestyle changes and possibly taking medicine. Living a healthy lifestyle can help lower high blood pressure. You may need to change some of your habits. Lifestyle changes may include:  Following the DASH diet. This diet is high in fruits, vegetables, and whole grains. It is low in salt, red meat, and added sugars.  Keep your sodium intake below 2,300 mg per day.  Getting at least 30-45 minutes of aerobic exercise at least 4 times per week.  Losing weight if necessary.  Not smoking.  Limiting alcoholic beverages.  Learning ways to reduce stress. Your health care provider  may prescribe medicine if lifestyle changes are not enough to get your blood pressure under control, and if one of the following is true:  You are 95-65 years of age and your systolic blood pressure is above 140.  You are 102 years of age or older, and your systolic blood pressure is above 150.  Your diastolic blood pressure is above 90.  You have diabetes, and your systolic blood pressure is over 140 or your diastolic blood pressure is over  90.  You have kidney disease and your blood pressure is above 140/90.  You have heart disease and your blood pressure is above 140/90. Your personal target blood pressure may vary depending on your medical conditions, your age, and other factors. HOME CARE INSTRUCTIONS  Have your blood pressure rechecked as directed by your health care provider.   Take medicines only as directed by your health care provider. Follow the directions carefully. Blood pressure medicines must be taken as prescribed. The medicine does not work as well when you skip doses. Skipping doses also puts you at risk for problems.  Do not smoke.   Monitor your blood pressure at home as directed by your health care provider. SEEK MEDICAL CARE IF:   You think you are having a reaction to medicines taken.  You have recurrent headaches or feel dizzy.  You have swelling in your ankles.  You have trouble with your vision. SEEK IMMEDIATE MEDICAL CARE IF:  You develop a severe headache or confusion.  You have unusual weakness, numbness, or feel faint.  You have severe chest or abdominal pain.  You vomit repeatedly.  You have trouble breathing. MAKE SURE YOU:   Understand these instructions.  Will watch your condition.  Will get help right away if you are not doing well or get worse.   This information is not intended to replace advice given to you by your health care provider. Make sure you discuss any questions you have with your health care provider.   Document Released: 06/22/2005 Document Revised: 11/06/2014 Document Reviewed: 04/14/2013 Elsevier Interactive Patient Education [employer]

## 2015-07-14 NOTE — Assessment & Plan Note (Signed)
Improved with trial of lexapro and prn xanax.  Advised to use xanax bid prn, rx rewritten

## 2015-07-14 NOTE — Assessment & Plan Note (Signed)
Untreated thus far given extenuated circumstances with each reading. He has no prior history of hypertension. He will check his blood pressure several times over the next 3-4 weeks and to submit readings for evaluation. Urine microalbumin to creatinine ratio done in June was  normal.  Lab Results  Component Value Date   MICROALBUR 0.9 12/20/2014

## 2015-07-14 NOTE — Assessment & Plan Note (Signed)
4 mm obstructing stone at the left vesicoureteral junction, minimal hydroneprhosis with perinephtic stranding noted.  flomax rx refilled , oxycodone refilled.  Urged to strain urine . Return to ER if left sided flank pain  escalates or if he develops fevers, nausea or gross hematuria

## 2015-08-10 ENCOUNTER — Other Ambulatory Visit: Payer: Self-pay | Admitting: Internal Medicine

## 2015-09-03 ENCOUNTER — Other Ambulatory Visit: Payer: Self-pay

## 2015-09-03 MED ORDER — TAMSULOSIN HCL 0.4 MG PO CAPS: 0.4000 mg | ORAL_CAPSULE | Freq: Every day | ORAL | Status: AC

## 2015-11-15 ENCOUNTER — Other Ambulatory Visit: Payer: Self-pay | Admitting: Internal Medicine

## 2015-11-18 NOTE — Telephone Encounter (Signed)
Xanax refill request.  Last seen 07/12/15, last filled 07/12/15.

## 2015-11-18 NOTE — Telephone Encounter (Signed)
Refill for 30 days only.  OFFICE VISIT NEEDED prior to any more refills 

## 2015-11-19 NOTE — Telephone Encounter (Signed)
Left patient detailed message need appointment for further refills on cell.

## 2015-12-17 ENCOUNTER — Other Ambulatory Visit: Payer: Self-pay | Admitting: Internal Medicine

## 2016-03-02 ENCOUNTER — Other Ambulatory Visit: Payer: Self-pay | Admitting: Internal Medicine

## 2016-03-03 NOTE — Telephone Encounter (Signed)
last seen 07/12/15. Medication refilled on 12/17/15. Please advise?

## 2016-03-04 NOTE — Telephone Encounter (Signed)
Refill for 30 days only.  OFFICE VISIT NEEDED prior to any more refills 

## 2016-04-20 ENCOUNTER — Other Ambulatory Visit: Payer: Self-pay | Admitting: Internal Medicine

## 2016-04-21 NOTE — Telephone Encounter (Signed)
Refilled 03/04/16. Pt last seen 07/12/15. Please advise?

## 2016-04-21 NOTE — Telephone Encounter (Signed)
Refill for 30 days only.  OFFICE VISIT NEEDED prior to any more refills. NO EXCEPTIONS  CALL PATINET AND NOTIFY NEED FOR APPT

## 2016-06-07 ENCOUNTER — Other Ambulatory Visit: Payer: Self-pay | Admitting: Internal Medicine

## 2016-06-09 NOTE — Telephone Encounter (Signed)
Okay to refill Xanax? Last filled on 04/21/16 #60 & no refills. Last OV: 07/12/15 & no future appt scheduled.

## 2016-06-10 ENCOUNTER — Other Ambulatory Visit: Payer: Self-pay | Admitting: *Deleted

## 2016-06-10 MED ORDER — ALPRAZOLAM 0.5 MG PO TABS: 0.5000 mg | ORAL_TABLET | Freq: Two times a day (BID) | ORAL | 0 refills | Status: AC | PRN

## 2016-06-10 NOTE — Telephone Encounter (Signed)
Called patient and lm on vm to call office and make a 1 mouth medication follow up.

## 2016-06-10 NOTE — Progress Notes (Unsigned)
Please schedule patient follow up for medication

## 2016-06-10 NOTE — Telephone Encounter (Signed)
Please schedule patient medication follow up.in one month

## 2016-07-02 ENCOUNTER — Other Ambulatory Visit: Payer: Self-pay | Admitting: Internal Medicine

## 2016-07-02 NOTE — Telephone Encounter (Signed)
Have not been able to reach patient by phone, requested for patient to give office a call and schedule an appointment for follow up for medication. Alprazolam filled 06/10/16  For 60 tablets no refills and patient has been advised needs OV for refills. Detailed message left 06/07/16.

## 2016-07-29 IMAGING — CT CT RENAL STONE PROTOCOL
1 of 2 series · 15 of 32 positions shown, 19 images · non-contrast
Comparison: None.

CLINICAL DATA: Acute onset of left-sided abdominal pain and nausea.
Initial encounter.

EXAM:
CT ABDOMEN AND PELVIS WITHOUT CONTRAST
TECHNIQUE: Multidetector CT imaging of the abdomen and pelvis was performed
following the standard protocol without IV contrast.

[Series 2: stone standard full · axial · 0.74mm/px · z∈[-510,-100]mm · 15 of 90 slices shown, 19 images]
[im 4/90  soft-tissue]
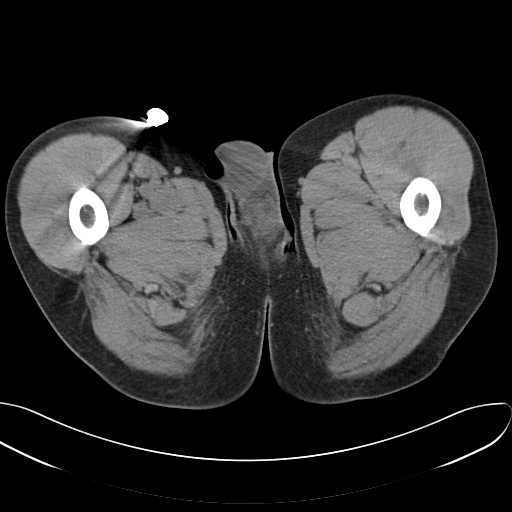
[im 4/90  bone]
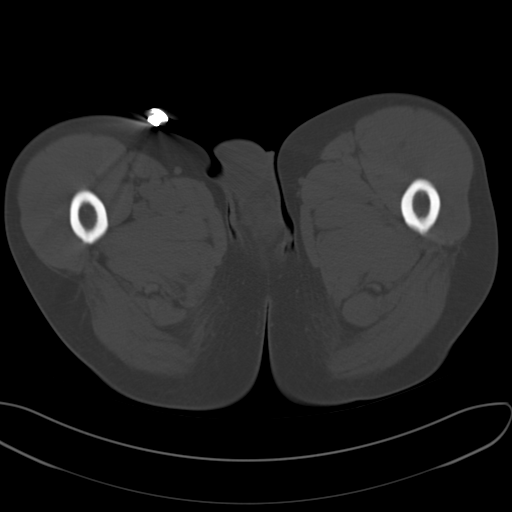
[im 11/90  soft-tissue]
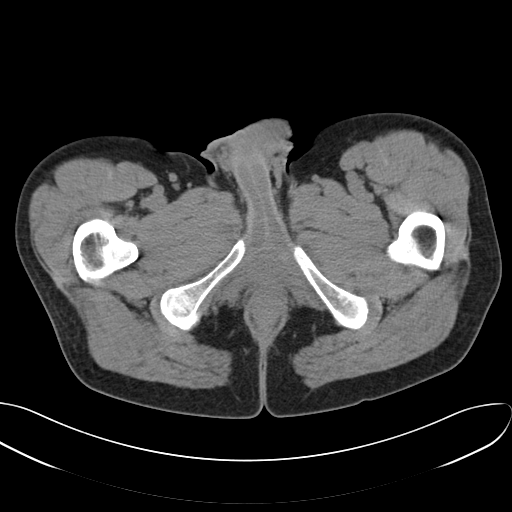
[im 18/90  soft-tissue]
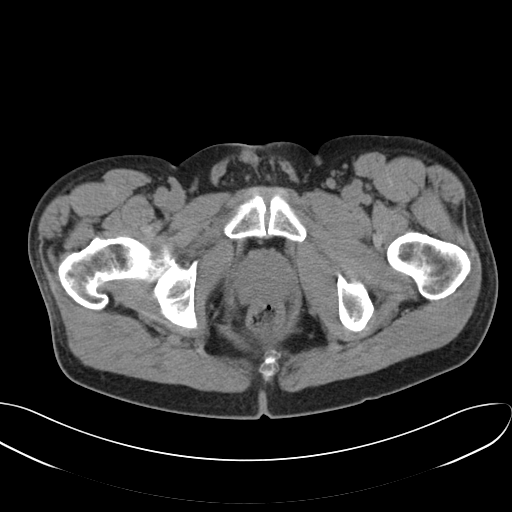
[im 25/90  soft-tissue]
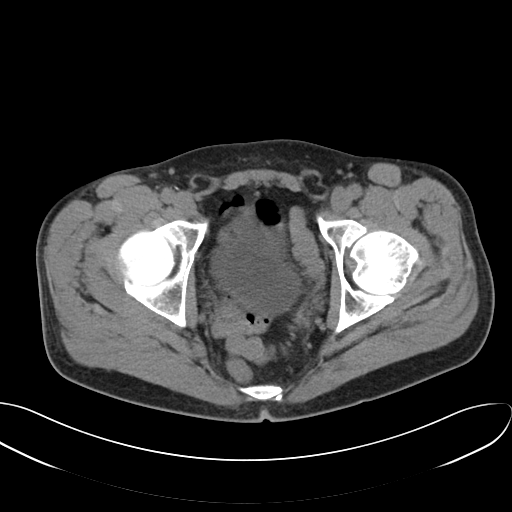
[im 33/90  soft-tissue]
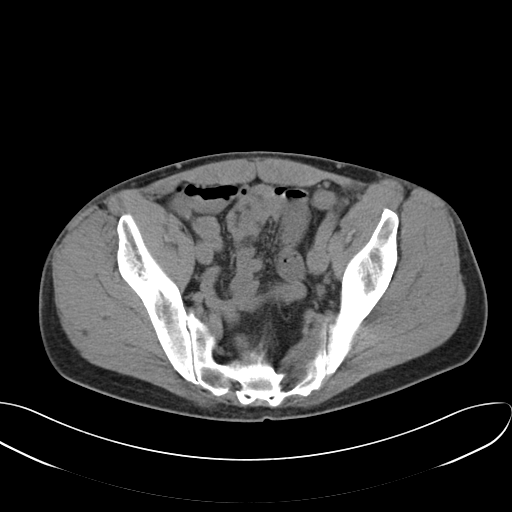
[im 40/90  soft-tissue]
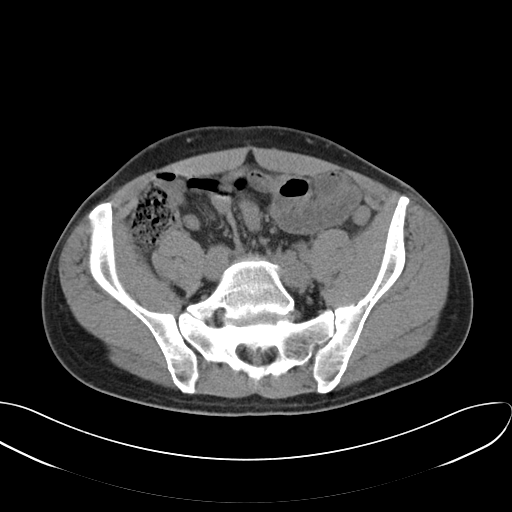
[im 47/90  soft-tissue]
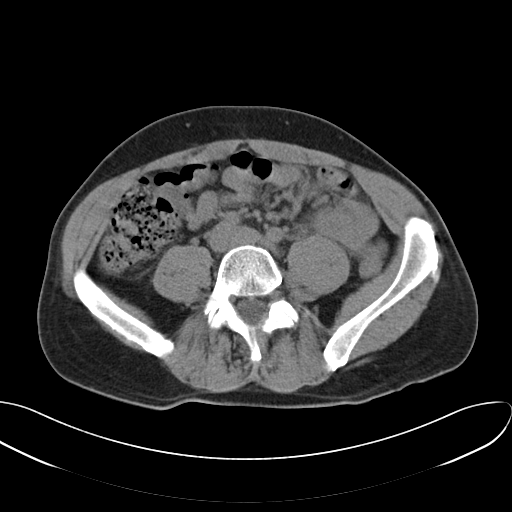
[im 50/90  soft-tissue]
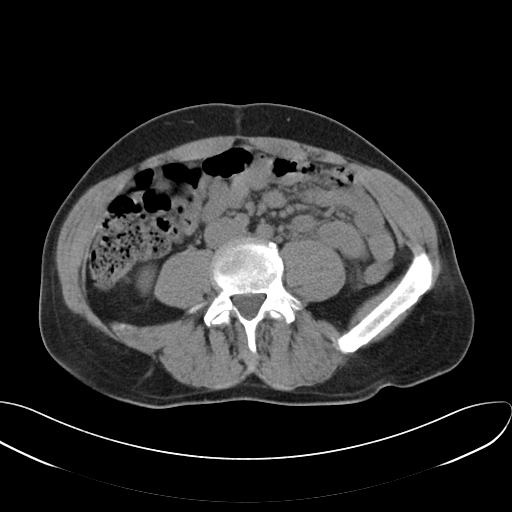
[im 57/90  soft-tissue]
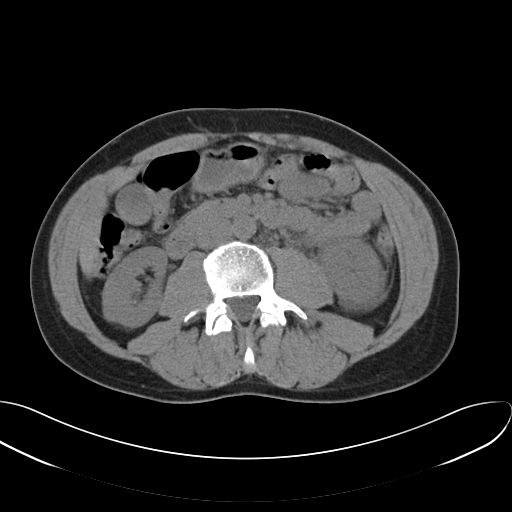
[im 57/90  bone]
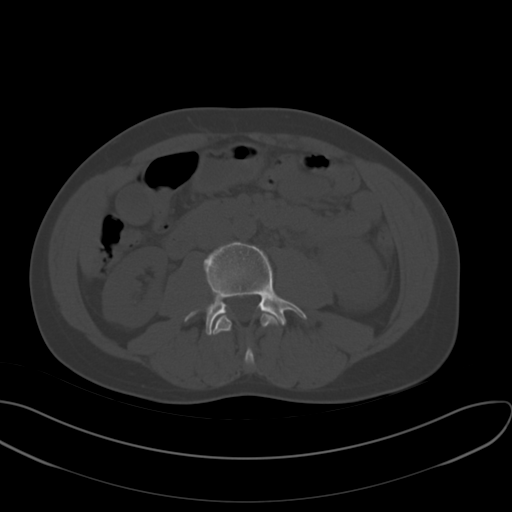
[im 65/90  soft-tissue]
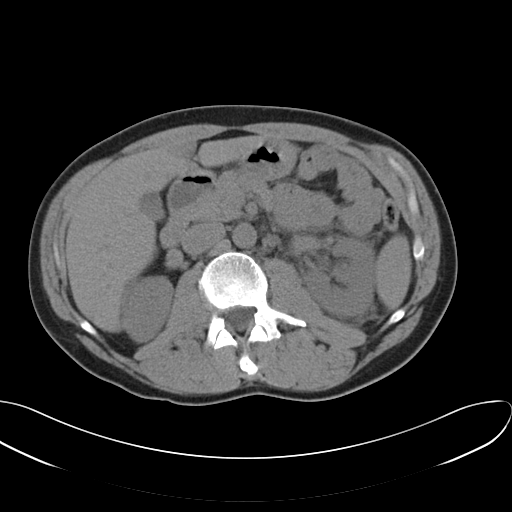
[im 72/90  soft-tissue]
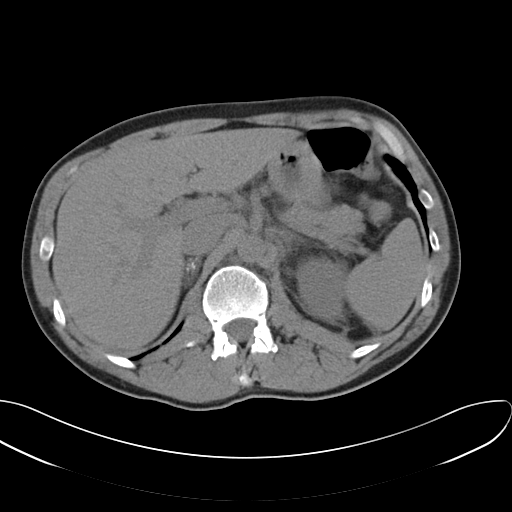
[im 75/90  lung]
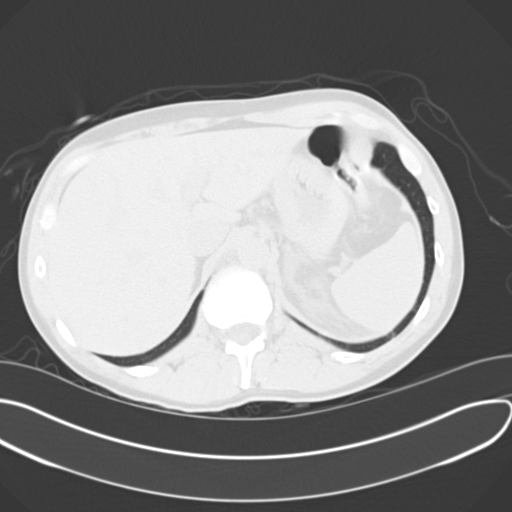
[im 79/90  soft-tissue]
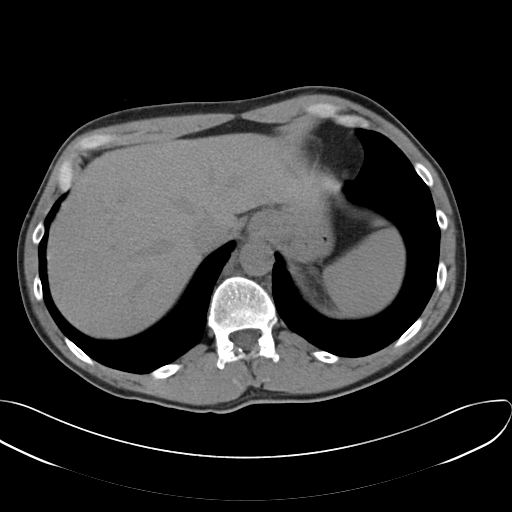
[im 79/90  lung]
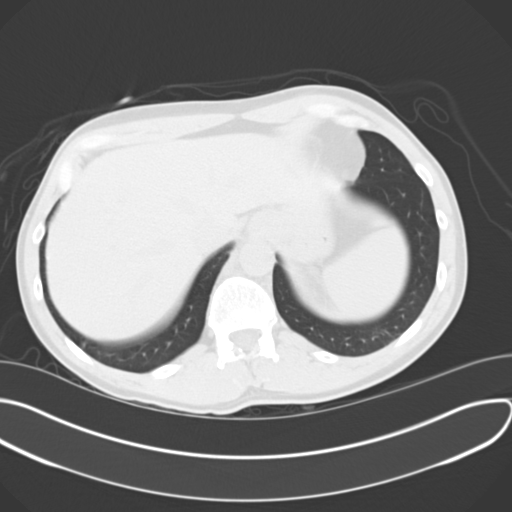
[im 82/90  lung]
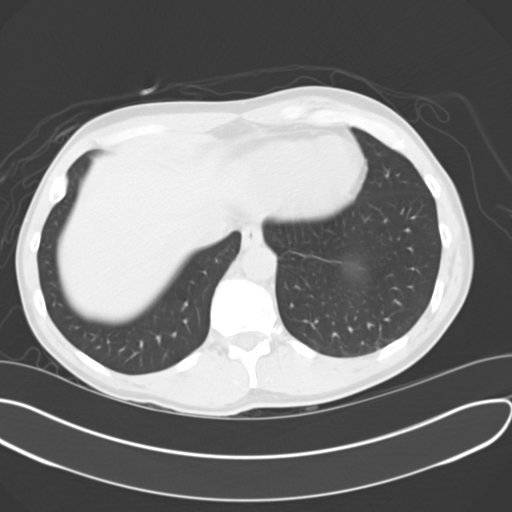
[im 86/90  soft-tissue]
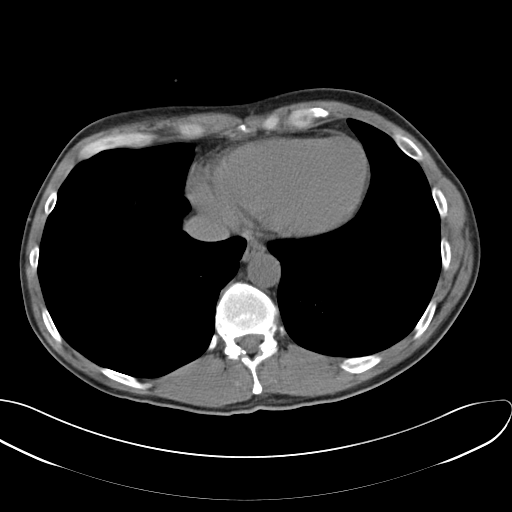
[im 86/90  lung]
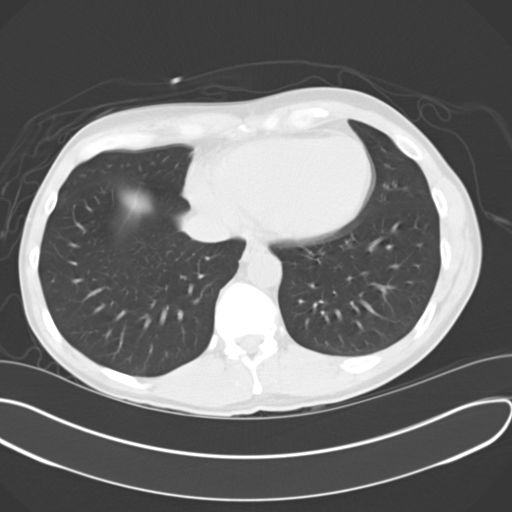

[15 of 32 positions shown; findings below may reference images not displayed]

FINDINGS: The visualized lung bases are clear.

The liver and spleen are unremarkable in appearance. The gallbladder
is within normal limits. The pancreas is unremarkable in appearance.
There is chronic calcification at the right adrenal gland,
reflecting remote injury. The left adrenal gland is grossly
unremarkable.

There is minimal left-sided hydronephrosis, with mild prominence of
the left ureter along its entire course. An obstructing 4 mm stone
is noted distally at the left vesicoureteral junction. Left-sided
perinephric stranding and fluid are noted. A 2 mm nonobstructing
stone is noted at the lower pole of the right kidney.

No free fluid is identified. The small bowel is unremarkable in
appearance. The stomach is within normal limits. No acute vascular
abnormalities are seen.

The appendix is normal in caliber and contains air, without evidence
of appendicitis. The colon is unremarkable in appearance.

The bladder is mildly distended and grossly remarkable. The prostate
remains normal in size. No inguinal lymphadenopathy is seen.

No acute osseous abnormalities are identified. Chronic bilateral
pars defects are seen at L5, without evidence of anterolisthesis.
IMPRESSION: 1. Minimal left-sided hydronephrosis, with an obstructing 4 mm stone
noted distally at the left vesicoureteral junction. Left-sided
perinephric stranding and fluid noted.
2. 2 mm nonobstructing stone at the lower pole of the right kidney.
3. Chronic bilateral pars defects at L5, without evidence of
anterolisthesis.

## 2022-07-15 ENCOUNTER — Emergency Department: Payer: 59

## 2022-07-15 ENCOUNTER — Encounter: Payer: Self-pay | Admitting: Emergency Medicine

## 2022-07-15 ENCOUNTER — Emergency Department
Admission: EM | Admit: 2022-07-15 | Discharge: 2022-07-15 | Disposition: A | Discharge status: Home / Self Care | Payer: 59 | Attending: Emergency Medicine | Admitting: Emergency Medicine

## 2022-07-15 ENCOUNTER — Other Ambulatory Visit: Payer: Self-pay

## 2022-07-15 DIAGNOSIS — M545 Low back pain, unspecified: Secondary | ICD-10-CM | POA: Insufficient documentation

## 2022-07-15 DIAGNOSIS — R109 Unspecified abdominal pain: Secondary | ICD-10-CM | POA: Diagnosis not present

## 2022-07-15 LAB — CBC
HCT: 40.6 % (ref 39.0–52.0)
Hemoglobin: 13.5 g/dL (ref 13.0–17.0)
MCH: 30.1 pg (ref 26.0–34.0)
MCHC: 33.3 g/dL (ref 30.0–36.0)
MCV: 90.6 fL (ref 80.0–100.0)
Platelets: 425 10*3/uL — ABNORMAL HIGH (ref 150–400)
RBC: 4.48 MIL/uL (ref 4.22–5.81)
RDW: 12.4 % (ref 11.5–15.5)
WBC: 5.8 10*3/uL (ref 4.0–10.5)
nRBC: 0 % (ref 0.0–0.2)

## 2022-07-15 LAB — URINALYSIS, ROUTINE W REFLEX MICROSCOPIC
Bilirubin Urine: NEGATIVE
Glucose, UA: NEGATIVE mg/dL
Hgb urine dipstick: NEGATIVE
Ketones, ur: NEGATIVE mg/dL
Leukocytes,Ua: NEGATIVE
Nitrite: NEGATIVE
Protein, ur: NEGATIVE mg/dL
Specific Gravity, Urine: 1.018 (ref 1.005–1.030)
pH: 5 (ref 5.0–8.0)

## 2022-07-15 LAB — BASIC METABOLIC PANEL
Anion gap: 5 (ref 5–15)
BUN: 18 mg/dL (ref 6–20)
CO2: 30 mmol/L (ref 22–32)
Calcium: 8.8 mg/dL — ABNORMAL LOW (ref 8.9–10.3)
Chloride: 105 mmol/L (ref 98–111)
Creatinine, Ser: 1.08 mg/dL (ref 0.61–1.24)
GFR, Estimated: >60 mL/min (ref >60)
Glucose, Bld: 83 mg/dL (ref 70–99)
Potassium: 4.5 mmol/L (ref 3.5–5.1)
Sodium: 140 mmol/L (ref 135–145)

## 2022-07-15 MED ORDER — ONDANSETRON 4 MG PO TBDP
4.0000 mg | ORAL_TABLET | Freq: Once | ORAL | Status: AC
  Administered 2022-07-15: 4 mg via ORAL
  Filled 2022-07-15: qty 1

## 2022-07-15 MED ORDER — KETOROLAC TROMETHAMINE 15 MG/ML IJ SOLN
15.0000 mg | Freq: Once | INTRAMUSCULAR | Status: AC
  Administered 2022-07-15: 15 mg via INTRAMUSCULAR
  Filled 2022-07-15: qty 1

## 2022-07-15 NOTE — Discharge Instructions (Signed)
Your CT scan does not show any kidney stone. I think you likely have a strain of your back causing your pain.  Take NSAIDs and you can use a heating pad.  Below are some primary doctor she can follow-up with if your pain is not going away.  Please go to the following website to schedule new (and existing) patient appointments:   [email or web address]   The following is a list of primary care offices in the area who are accepting new patients at this time.  Please reach out to one of them directly and let them know you would like to schedule an appointment to follow up on an Emergency Department visit, and/or to establish a new primary care provider (PCP).  There are likely other primary care clinics in the are who are accepting new patients, but this is an excellent place to start:  [facility] physician: Dr Lavon Paganini [address] [place], [place] [address] [phone]  [facility] Lead Physician: Dr Steele Sizer [address], [place], [place] [address] [phone]  [facility] Physician: Dr Park Liter [address] [place], [place] [address] [phone]  [facility] Lead Physician: Dr Dewaine Oats [address], [place], [place] [address] [phone]  [facility] [place] Physician: Dr Halina Maidens [address] [place], [place], [place] [address] [phone]

## 2022-07-15 NOTE — ED Provider Notes (Signed)
[facility] Provider Note    Event Date/Time   First MD Initiated Contact with Patient 07/15/22 [phone]     (approximate)   History   Flank Pain (/)   HPI  Jeffery LIGHTSEY Sr. is a 57 y.o. male past medical history of kidney stones who presents with flank pain.  Patient started having some right low back pain back when he got the flu over Christmas.  Towards the end pain became worse and for about a week he has had pain right flank radiating around to the right abdomen.  Had an episode of nausea vomiting yesterday.  Denies urinary symptoms denies fevers chills.  Does have history of kidney stones says this feels similar.    Past Medical History:  Diagnosis Date   Renal calculi 2009   Thyroid disease    hyperthyroidism    Patient Active Problem List   Diagnosis Date Noted   Anxiety as acute reaction to exceptional stress 06/23/2015   Elevated blood-pressure reading without diagnosis of hypertension 12/20/2014   Plantar fasciitis, left 03/22/2013   Bilateral chronic knee pain 09/02/2011   Ureteral calculi    Thyroid disease      Physical Exam  Triage Vital Signs: ED Triage Vitals [07/15/22 0912]  Enc Vitals Group     BP (!) 149/104     Pulse Rate 75     Resp 18     Temp 97.7 F (36.5 C)     Temp Source Oral     SpO2 100 %     Weight 155 lb (70.3 kg)     Height 5\' 9"  (1.753 m)     Head Circumference      Peak Flow      Pain Score 9     Pain Loc      Pain Edu?      Excl. in [place]?     Most recent vital signs: Vitals:   07/15/22 0912  BP: (!) 149/104  Pulse: 75  Resp: 18  Temp: 97.7 F (36.5 C)  SpO2: 100%     General: Awake, no distress.  CV:  Good peripheral perfusion.  Resp:  Normal effort.  Abd:  No distention.  No significant right lower quadrant tenderness abdomen soft Neuro:             Awake, Alert, Oriented x 3  Other:  No CVA tenderness or midline lumbar paraspinal tenderness   ED Results / Procedures /  Treatments  Labs (all labs ordered are listed, but only abnormal results are displayed) Labs Reviewed  URINALYSIS, ROUTINE W REFLEX MICROSCOPIC - Abnormal; Notable for the following components:      Result Value   Color, Urine YELLOW (*)    APPearance CLEAR (*)    All other components within normal limits  BASIC METABOLIC PANEL - Abnormal; Notable for the following components:   Calcium 8.8 (*)    All other components within normal limits  CBC - Abnormal; Notable for the following components:   Platelets 425 (*)    All other components within normal limits     EKG     RADIOLOGY    PROCEDURES:  Critical Care performed: No  Procedures   MEDICATIONS ORDERED IN ED: Medications  ondansetron (ZOFRAN-ODT) disintegrating tablet 4 mg (4 mg Oral Given 07/15/22 0938)  ketorolac (TORADOL) 15 MG/ML injection 15 mg (15 mg Intramuscular Given 07/15/22 0938)     IMPRESSION / MDM / [place] / ED  COURSE  I reviewed the triage vital signs and the nursing notes.                              Patient's presentation is most consistent with acute complicated illness / injury requiring diagnostic workup.  Differential diagnosis includes, but is not limited to, musculoskeletal pain, kidney stone, UTI, pyelonephritis  Patient is a 58 year old male with prior history of kidney stones presenting with about a week of flank pain.  Started while he had the flu and after flu symptoms went away back pain persisted and now is rating around to the right abdomen.  There is no urinary symptoms had some transient vomiting yesterday with some nausea no fevers or chills.  On exam he really does not have CVA tenderness or midline or paraspinal lumbar tenderness abdomen is benign.  UA is negative for hematuria or pyuria.  Suspect this is more likely to be musculoskeletal but will obtain CT renal study.  Will give IM Toradol ODT Zofran.   CT renal study is negative.  Discussed results with patient.   Suspect this is musculoskeletal.  Given patient was having some radiation of pain into the groin I did perform a GU exam which is reassuring no signs of testicular process.  Discussed NSAID use.  He is appropriate for discharge.   FINAL CLINICAL IMPRESSION(S) / ED DIAGNOSES   Final diagnoses:  Flank pain     Rx / DC Orders   ED Discharge Orders     None        Note:  This document was prepared using Dragon voice recognition software and may include unintentional dictation errors.   Rada Hay, MD 07/15/22 1040

## 2022-07-15 NOTE — ED Triage Notes (Signed)
Patient to ED via POV for right sided flank pain. Pain ongoing for past 5 days. Some difficulties urinating. Hx of kidney stones.
